# Patient Record
Sex: Female | Born: 1970 | Race: White | Hispanic: No | Marital: Single | Smoking: Never smoker
Health system: Southern US, Community
[De-identification: ages and names within clinical notes are randomized; demographics above are authoritative.]

## PROBLEM LIST (undated history)

## (undated) DIAGNOSIS — E669 Obesity, unspecified: Secondary | ICD-10-CM

## (undated) DIAGNOSIS — F411 Generalized anxiety disorder: Secondary | ICD-10-CM

## (undated) DIAGNOSIS — G4700 Insomnia, unspecified: Secondary | ICD-10-CM

## (undated) DIAGNOSIS — E890 Postprocedural hypothyroidism: Secondary | ICD-10-CM

## (undated) DIAGNOSIS — Z85850 Personal history of malignant neoplasm of thyroid: Secondary | ICD-10-CM

## (undated) HISTORY — DX: Personal history of malignant neoplasm of thyroid: Z85.850

## (undated) HISTORY — PX: THYROIDECTOMY: SHX17

## (undated) HISTORY — DX: Obesity, unspecified: E66.9

## (undated) HISTORY — PX: COLONOSCOPY: SHX174

## (undated) HISTORY — DX: Generalized anxiety disorder: F41.1

## (undated) HISTORY — DX: Insomnia, unspecified: G47.00

## (undated) HISTORY — PX: ABDOMINAL HYSTERECTOMY: SUR658

---

## 2014-04-30 DIAGNOSIS — C73 Malignant neoplasm of thyroid gland: Secondary | ICD-10-CM

## 2014-04-30 HISTORY — DX: Malignant neoplasm of thyroid gland: C73

## 2015-02-08 DIAGNOSIS — C73 Malignant neoplasm of thyroid gland: Secondary | ICD-10-CM | POA: Insufficient documentation

## 2015-04-18 DIAGNOSIS — E890 Postprocedural hypothyroidism: Secondary | ICD-10-CM | POA: Diagnosis present

## 2015-09-19 DIAGNOSIS — C73 Malignant neoplasm of thyroid gland: Secondary | ICD-10-CM | POA: Diagnosis not present

## 2015-09-19 DIAGNOSIS — E890 Postprocedural hypothyroidism: Secondary | ICD-10-CM | POA: Diagnosis not present

## 2015-09-19 DIAGNOSIS — Z79899 Other long term (current) drug therapy: Secondary | ICD-10-CM | POA: Diagnosis not present

## 2015-11-02 DIAGNOSIS — R600 Localized edema: Secondary | ICD-10-CM | POA: Diagnosis not present

## 2015-12-03 DIAGNOSIS — M940 Chondrocostal junction syndrome [Tietze]: Secondary | ICD-10-CM | POA: Diagnosis not present

## 2015-12-19 DIAGNOSIS — K080 Exfoliation of teeth due to systemic causes: Secondary | ICD-10-CM | POA: Diagnosis not present

## 2015-12-20 DIAGNOSIS — C73 Malignant neoplasm of thyroid gland: Secondary | ICD-10-CM | POA: Diagnosis not present

## 2016-02-03 DIAGNOSIS — Z1389 Encounter for screening for other disorder: Secondary | ICD-10-CM | POA: Diagnosis not present

## 2016-02-03 DIAGNOSIS — E669 Obesity, unspecified: Secondary | ICD-10-CM | POA: Diagnosis not present

## 2016-02-03 DIAGNOSIS — Z0000 Encounter for general adult medical examination without abnormal findings: Secondary | ICD-10-CM | POA: Diagnosis not present

## 2016-02-03 DIAGNOSIS — Z6830 Body mass index (BMI) 30.0-30.9, adult: Secondary | ICD-10-CM | POA: Diagnosis not present

## 2016-06-04 DIAGNOSIS — Z79899 Other long term (current) drug therapy: Secondary | ICD-10-CM | POA: Diagnosis not present

## 2016-06-04 DIAGNOSIS — E890 Postprocedural hypothyroidism: Secondary | ICD-10-CM | POA: Diagnosis not present

## 2016-06-04 DIAGNOSIS — C73 Malignant neoplasm of thyroid gland: Secondary | ICD-10-CM | POA: Diagnosis not present

## 2016-06-04 DIAGNOSIS — Z85850 Personal history of malignant neoplasm of thyroid: Secondary | ICD-10-CM | POA: Diagnosis not present

## 2016-06-25 DIAGNOSIS — M25552 Pain in left hip: Secondary | ICD-10-CM | POA: Diagnosis not present

## 2016-06-25 DIAGNOSIS — M25551 Pain in right hip: Secondary | ICD-10-CM | POA: Diagnosis not present

## 2016-06-25 DIAGNOSIS — K080 Exfoliation of teeth due to systemic causes: Secondary | ICD-10-CM | POA: Diagnosis not present

## 2016-08-22 DIAGNOSIS — J0100 Acute maxillary sinusitis, unspecified: Secondary | ICD-10-CM | POA: Diagnosis not present

## 2016-08-22 DIAGNOSIS — M62838 Other muscle spasm: Secondary | ICD-10-CM | POA: Diagnosis not present

## 2016-09-05 DIAGNOSIS — M79604 Pain in right leg: Secondary | ICD-10-CM | POA: Diagnosis not present

## 2016-09-05 DIAGNOSIS — M79605 Pain in left leg: Secondary | ICD-10-CM | POA: Diagnosis not present

## 2016-09-05 DIAGNOSIS — M545 Low back pain: Secondary | ICD-10-CM | POA: Diagnosis not present

## 2016-09-05 DIAGNOSIS — M6281 Muscle weakness (generalized): Secondary | ICD-10-CM | POA: Diagnosis not present

## 2016-09-12 DIAGNOSIS — M545 Low back pain: Secondary | ICD-10-CM | POA: Diagnosis not present

## 2016-09-12 DIAGNOSIS — M79604 Pain in right leg: Secondary | ICD-10-CM | POA: Diagnosis not present

## 2016-09-12 DIAGNOSIS — M6281 Muscle weakness (generalized): Secondary | ICD-10-CM | POA: Diagnosis not present

## 2016-09-12 DIAGNOSIS — M79605 Pain in left leg: Secondary | ICD-10-CM | POA: Diagnosis not present

## 2016-09-17 DIAGNOSIS — M6281 Muscle weakness (generalized): Secondary | ICD-10-CM | POA: Diagnosis not present

## 2016-09-17 DIAGNOSIS — M79605 Pain in left leg: Secondary | ICD-10-CM | POA: Diagnosis not present

## 2016-09-17 DIAGNOSIS — M79604 Pain in right leg: Secondary | ICD-10-CM | POA: Diagnosis not present

## 2016-09-17 DIAGNOSIS — M545 Low back pain: Secondary | ICD-10-CM | POA: Diagnosis not present

## 2016-09-19 DIAGNOSIS — M545 Low back pain: Secondary | ICD-10-CM | POA: Diagnosis not present

## 2016-09-19 DIAGNOSIS — M79604 Pain in right leg: Secondary | ICD-10-CM | POA: Diagnosis not present

## 2016-09-19 DIAGNOSIS — M6281 Muscle weakness (generalized): Secondary | ICD-10-CM | POA: Diagnosis not present

## 2016-09-19 DIAGNOSIS — M79605 Pain in left leg: Secondary | ICD-10-CM | POA: Diagnosis not present

## 2016-09-25 DIAGNOSIS — M79604 Pain in right leg: Secondary | ICD-10-CM | POA: Diagnosis not present

## 2016-09-25 DIAGNOSIS — M6281 Muscle weakness (generalized): Secondary | ICD-10-CM | POA: Diagnosis not present

## 2016-09-25 DIAGNOSIS — M79605 Pain in left leg: Secondary | ICD-10-CM | POA: Diagnosis not present

## 2016-09-25 DIAGNOSIS — M545 Low back pain: Secondary | ICD-10-CM | POA: Diagnosis not present

## 2016-09-28 DIAGNOSIS — M79605 Pain in left leg: Secondary | ICD-10-CM | POA: Diagnosis not present

## 2016-09-28 DIAGNOSIS — M79604 Pain in right leg: Secondary | ICD-10-CM | POA: Diagnosis not present

## 2016-09-28 DIAGNOSIS — M6281 Muscle weakness (generalized): Secondary | ICD-10-CM | POA: Diagnosis not present

## 2016-09-28 DIAGNOSIS — M545 Low back pain: Secondary | ICD-10-CM | POA: Diagnosis not present

## 2016-10-05 DIAGNOSIS — M79604 Pain in right leg: Secondary | ICD-10-CM | POA: Diagnosis not present

## 2016-10-05 DIAGNOSIS — M79605 Pain in left leg: Secondary | ICD-10-CM | POA: Diagnosis not present

## 2016-10-05 DIAGNOSIS — M545 Low back pain: Secondary | ICD-10-CM | POA: Diagnosis not present

## 2016-10-05 DIAGNOSIS — M6281 Muscle weakness (generalized): Secondary | ICD-10-CM | POA: Diagnosis not present

## 2016-10-09 DIAGNOSIS — M6281 Muscle weakness (generalized): Secondary | ICD-10-CM | POA: Diagnosis not present

## 2016-10-09 DIAGNOSIS — M79605 Pain in left leg: Secondary | ICD-10-CM | POA: Diagnosis not present

## 2016-10-09 DIAGNOSIS — M79604 Pain in right leg: Secondary | ICD-10-CM | POA: Diagnosis not present

## 2016-10-09 DIAGNOSIS — M545 Low back pain: Secondary | ICD-10-CM | POA: Diagnosis not present

## 2016-10-19 DIAGNOSIS — M79604 Pain in right leg: Secondary | ICD-10-CM | POA: Diagnosis not present

## 2016-10-19 DIAGNOSIS — M545 Low back pain: Secondary | ICD-10-CM | POA: Diagnosis not present

## 2016-10-19 DIAGNOSIS — M79605 Pain in left leg: Secondary | ICD-10-CM | POA: Diagnosis not present

## 2016-10-19 DIAGNOSIS — M6281 Muscle weakness (generalized): Secondary | ICD-10-CM | POA: Diagnosis not present

## 2016-10-24 DIAGNOSIS — M79605 Pain in left leg: Secondary | ICD-10-CM | POA: Diagnosis not present

## 2016-10-24 DIAGNOSIS — M79604 Pain in right leg: Secondary | ICD-10-CM | POA: Diagnosis not present

## 2016-10-24 DIAGNOSIS — M6281 Muscle weakness (generalized): Secondary | ICD-10-CM | POA: Diagnosis not present

## 2016-10-24 DIAGNOSIS — M545 Low back pain: Secondary | ICD-10-CM | POA: Diagnosis not present

## 2016-10-29 DIAGNOSIS — M79605 Pain in left leg: Secondary | ICD-10-CM | POA: Diagnosis not present

## 2016-10-29 DIAGNOSIS — M79604 Pain in right leg: Secondary | ICD-10-CM | POA: Diagnosis not present

## 2016-10-29 DIAGNOSIS — M545 Low back pain: Secondary | ICD-10-CM | POA: Diagnosis not present

## 2016-10-29 DIAGNOSIS — M6281 Muscle weakness (generalized): Secondary | ICD-10-CM | POA: Diagnosis not present

## 2016-11-06 DIAGNOSIS — M6281 Muscle weakness (generalized): Secondary | ICD-10-CM | POA: Diagnosis not present

## 2016-11-06 DIAGNOSIS — M79605 Pain in left leg: Secondary | ICD-10-CM | POA: Diagnosis not present

## 2016-11-06 DIAGNOSIS — M545 Low back pain: Secondary | ICD-10-CM | POA: Diagnosis not present

## 2016-11-06 DIAGNOSIS — M79604 Pain in right leg: Secondary | ICD-10-CM | POA: Diagnosis not present

## 2016-11-14 DIAGNOSIS — M79605 Pain in left leg: Secondary | ICD-10-CM | POA: Diagnosis not present

## 2016-11-14 DIAGNOSIS — M79604 Pain in right leg: Secondary | ICD-10-CM | POA: Diagnosis not present

## 2016-11-14 DIAGNOSIS — M545 Low back pain: Secondary | ICD-10-CM | POA: Diagnosis not present

## 2016-11-14 DIAGNOSIS — M6281 Muscle weakness (generalized): Secondary | ICD-10-CM | POA: Diagnosis not present

## 2016-11-26 DIAGNOSIS — D2271 Melanocytic nevi of right lower limb, including hip: Secondary | ICD-10-CM | POA: Diagnosis not present

## 2016-11-26 DIAGNOSIS — C44112 Basal cell carcinoma of skin of right eyelid, including canthus: Secondary | ICD-10-CM | POA: Diagnosis not present

## 2016-11-26 DIAGNOSIS — D485 Neoplasm of uncertain behavior of skin: Secondary | ICD-10-CM | POA: Diagnosis not present

## 2016-11-26 DIAGNOSIS — D1801 Hemangioma of skin and subcutaneous tissue: Secondary | ICD-10-CM | POA: Diagnosis not present

## 2016-11-28 DIAGNOSIS — M6281 Muscle weakness (generalized): Secondary | ICD-10-CM | POA: Diagnosis not present

## 2016-11-28 DIAGNOSIS — M545 Low back pain: Secondary | ICD-10-CM | POA: Diagnosis not present

## 2016-11-28 DIAGNOSIS — M79605 Pain in left leg: Secondary | ICD-10-CM | POA: Diagnosis not present

## 2016-11-28 DIAGNOSIS — M79604 Pain in right leg: Secondary | ICD-10-CM | POA: Diagnosis not present

## 2016-12-24 DIAGNOSIS — K080 Exfoliation of teeth due to systemic causes: Secondary | ICD-10-CM | POA: Diagnosis not present

## 2016-12-25 DIAGNOSIS — C73 Malignant neoplasm of thyroid gland: Secondary | ICD-10-CM | POA: Diagnosis not present

## 2016-12-25 DIAGNOSIS — E559 Vitamin D deficiency, unspecified: Secondary | ICD-10-CM | POA: Diagnosis not present

## 2016-12-25 DIAGNOSIS — E890 Postprocedural hypothyroidism: Secondary | ICD-10-CM | POA: Diagnosis not present

## 2017-02-04 DIAGNOSIS — E039 Hypothyroidism, unspecified: Secondary | ICD-10-CM | POA: Diagnosis not present

## 2017-02-04 DIAGNOSIS — Z23 Encounter for immunization: Secondary | ICD-10-CM | POA: Diagnosis not present

## 2017-06-01 DIAGNOSIS — R509 Fever, unspecified: Secondary | ICD-10-CM | POA: Diagnosis not present

## 2017-06-01 DIAGNOSIS — J0100 Acute maxillary sinusitis, unspecified: Secondary | ICD-10-CM | POA: Diagnosis not present

## 2017-06-01 DIAGNOSIS — R05 Cough: Secondary | ICD-10-CM | POA: Diagnosis not present

## 2017-06-18 DIAGNOSIS — K080 Exfoliation of teeth due to systemic causes: Secondary | ICD-10-CM | POA: Diagnosis not present

## 2017-07-01 DIAGNOSIS — E890 Postprocedural hypothyroidism: Secondary | ICD-10-CM | POA: Diagnosis not present

## 2017-07-01 DIAGNOSIS — E559 Vitamin D deficiency, unspecified: Secondary | ICD-10-CM | POA: Diagnosis not present

## 2017-07-01 DIAGNOSIS — C73 Malignant neoplasm of thyroid gland: Secondary | ICD-10-CM | POA: Diagnosis not present

## 2017-08-12 DIAGNOSIS — J0100 Acute maxillary sinusitis, unspecified: Secondary | ICD-10-CM | POA: Diagnosis not present

## 2017-10-28 DIAGNOSIS — J069 Acute upper respiratory infection, unspecified: Secondary | ICD-10-CM | POA: Diagnosis not present

## 2017-11-27 DIAGNOSIS — J351 Hypertrophy of tonsils: Secondary | ICD-10-CM | POA: Diagnosis not present

## 2017-12-24 DIAGNOSIS — K080 Exfoliation of teeth due to systemic causes: Secondary | ICD-10-CM | POA: Diagnosis not present

## 2018-01-09 DIAGNOSIS — Z0000 Encounter for general adult medical examination without abnormal findings: Secondary | ICD-10-CM | POA: Diagnosis not present

## 2018-01-09 DIAGNOSIS — E039 Hypothyroidism, unspecified: Secondary | ICD-10-CM | POA: Diagnosis not present

## 2018-01-09 DIAGNOSIS — Z85850 Personal history of malignant neoplasm of thyroid: Secondary | ICD-10-CM | POA: Diagnosis not present

## 2018-01-09 DIAGNOSIS — Z1331 Encounter for screening for depression: Secondary | ICD-10-CM | POA: Diagnosis not present

## 2018-01-09 DIAGNOSIS — K080 Exfoliation of teeth due to systemic causes: Secondary | ICD-10-CM | POA: Diagnosis not present

## 2018-01-09 DIAGNOSIS — Z6832 Body mass index (BMI) 32.0-32.9, adult: Secondary | ICD-10-CM | POA: Diagnosis not present

## 2018-02-07 DIAGNOSIS — Z1231 Encounter for screening mammogram for malignant neoplasm of breast: Secondary | ICD-10-CM | POA: Diagnosis not present

## 2018-06-30 DIAGNOSIS — K080 Exfoliation of teeth due to systemic causes: Secondary | ICD-10-CM | POA: Diagnosis not present

## 2018-11-25 DIAGNOSIS — Z0000 Encounter for general adult medical examination without abnormal findings: Secondary | ICD-10-CM | POA: Diagnosis not present

## 2018-11-25 DIAGNOSIS — Z1331 Encounter for screening for depression: Secondary | ICD-10-CM | POA: Diagnosis not present

## 2018-11-25 DIAGNOSIS — E669 Obesity, unspecified: Secondary | ICD-10-CM | POA: Diagnosis not present

## 2018-11-25 DIAGNOSIS — Z13220 Encounter for screening for lipoid disorders: Secondary | ICD-10-CM | POA: Diagnosis not present

## 2018-11-25 DIAGNOSIS — F411 Generalized anxiety disorder: Secondary | ICD-10-CM | POA: Diagnosis not present

## 2018-11-25 DIAGNOSIS — E039 Hypothyroidism, unspecified: Secondary | ICD-10-CM | POA: Diagnosis not present

## 2019-01-01 DIAGNOSIS — K080 Exfoliation of teeth due to systemic causes: Secondary | ICD-10-CM | POA: Diagnosis not present

## 2019-01-26 DIAGNOSIS — D225 Melanocytic nevi of trunk: Secondary | ICD-10-CM | POA: Diagnosis not present

## 2019-01-26 DIAGNOSIS — D2239 Melanocytic nevi of other parts of face: Secondary | ICD-10-CM | POA: Diagnosis not present

## 2019-01-26 DIAGNOSIS — D1801 Hemangioma of skin and subcutaneous tissue: Secondary | ICD-10-CM | POA: Diagnosis not present

## 2019-01-26 DIAGNOSIS — L821 Other seborrheic keratosis: Secondary | ICD-10-CM | POA: Diagnosis not present

## 2019-02-11 DIAGNOSIS — R590 Localized enlarged lymph nodes: Secondary | ICD-10-CM | POA: Diagnosis not present

## 2019-02-11 DIAGNOSIS — C73 Malignant neoplasm of thyroid gland: Secondary | ICD-10-CM | POA: Diagnosis not present

## 2019-02-16 DIAGNOSIS — E890 Postprocedural hypothyroidism: Secondary | ICD-10-CM | POA: Diagnosis not present

## 2019-02-16 DIAGNOSIS — C73 Malignant neoplasm of thyroid gland: Secondary | ICD-10-CM | POA: Diagnosis not present

## 2019-02-23 DIAGNOSIS — J358 Other chronic diseases of tonsils and adenoids: Secondary | ICD-10-CM | POA: Diagnosis not present

## 2019-04-01 DIAGNOSIS — Z1231 Encounter for screening mammogram for malignant neoplasm of breast: Secondary | ICD-10-CM | POA: Diagnosis not present

## 2019-05-06 DIAGNOSIS — R928 Other abnormal and inconclusive findings on diagnostic imaging of breast: Secondary | ICD-10-CM | POA: Diagnosis not present

## 2019-08-04 DIAGNOSIS — N762 Acute vulvitis: Secondary | ICD-10-CM | POA: Diagnosis not present

## 2019-08-04 DIAGNOSIS — Z124 Encounter for screening for malignant neoplasm of cervix: Secondary | ICD-10-CM | POA: Diagnosis not present

## 2019-09-08 ENCOUNTER — Emergency Department (HOSPITAL_COMMUNITY): Payer: BC Managed Care – PPO

## 2019-09-08 ENCOUNTER — Other Ambulatory Visit: Payer: Self-pay

## 2019-09-08 ENCOUNTER — Inpatient Hospital Stay (HOSPITAL_COMMUNITY)
Admission: EM | Admit: 2019-09-08 | Discharge: 2019-09-11 | DRG: 417 | Disposition: A | Discharge status: Home / Self Care | Payer: BC Managed Care – PPO | Attending: Internal Medicine | Admitting: Internal Medicine

## 2019-09-08 DIAGNOSIS — K8020 Calculus of gallbladder without cholecystitis without obstruction: Secondary | ICD-10-CM | POA: Diagnosis not present

## 2019-09-08 DIAGNOSIS — Z20822 Contact with and (suspected) exposure to covid-19: Secondary | ICD-10-CM | POA: Diagnosis not present

## 2019-09-08 DIAGNOSIS — K859 Acute pancreatitis without necrosis or infection, unspecified: Secondary | ICD-10-CM

## 2019-09-08 DIAGNOSIS — K8062 Calculus of gallbladder and bile duct with acute cholecystitis without obstruction: Principal | ICD-10-CM | POA: Diagnosis present

## 2019-09-08 DIAGNOSIS — E890 Postprocedural hypothyroidism: Secondary | ICD-10-CM | POA: Diagnosis not present

## 2019-09-08 DIAGNOSIS — K915 Postcholecystectomy syndrome: Secondary | ICD-10-CM | POA: Diagnosis not present

## 2019-09-08 DIAGNOSIS — Z90711 Acquired absence of uterus with remaining cervical stump: Secondary | ICD-10-CM | POA: Diagnosis not present

## 2019-09-08 DIAGNOSIS — R1011 Right upper quadrant pain: Secondary | ICD-10-CM | POA: Diagnosis not present

## 2019-09-08 DIAGNOSIS — Z79899 Other long term (current) drug therapy: Secondary | ICD-10-CM | POA: Diagnosis not present

## 2019-09-08 DIAGNOSIS — K810 Acute cholecystitis: Secondary | ICD-10-CM | POA: Diagnosis present

## 2019-09-08 DIAGNOSIS — K8010 Calculus of gallbladder with chronic cholecystitis without obstruction: Secondary | ICD-10-CM | POA: Diagnosis not present

## 2019-09-08 DIAGNOSIS — R945 Abnormal results of liver function studies: Secondary | ICD-10-CM | POA: Diagnosis not present

## 2019-09-08 DIAGNOSIS — Z800 Family history of malignant neoplasm of digestive organs: Secondary | ICD-10-CM | POA: Diagnosis not present

## 2019-09-08 DIAGNOSIS — Z85850 Personal history of malignant neoplasm of thyroid: Secondary | ICD-10-CM

## 2019-09-08 DIAGNOSIS — K660 Peritoneal adhesions (postprocedural) (postinfection): Secondary | ICD-10-CM | POA: Diagnosis present

## 2019-09-08 DIAGNOSIS — K851 Biliary acute pancreatitis without necrosis or infection: Secondary | ICD-10-CM | POA: Diagnosis not present

## 2019-09-08 DIAGNOSIS — R1084 Generalized abdominal pain: Secondary | ICD-10-CM | POA: Diagnosis not present

## 2019-09-08 DIAGNOSIS — R1013 Epigastric pain: Secondary | ICD-10-CM | POA: Diagnosis not present

## 2019-09-08 DIAGNOSIS — G8929 Other chronic pain: Secondary | ICD-10-CM | POA: Diagnosis not present

## 2019-09-08 DIAGNOSIS — Z79890 Hormone replacement therapy (postmenopausal): Secondary | ICD-10-CM | POA: Diagnosis not present

## 2019-09-08 DIAGNOSIS — R7989 Other specified abnormal findings of blood chemistry: Secondary | ICD-10-CM | POA: Diagnosis not present

## 2019-09-08 DIAGNOSIS — Z419 Encounter for procedure for purposes other than remedying health state, unspecified: Secondary | ICD-10-CM

## 2019-09-08 DIAGNOSIS — R1012 Left upper quadrant pain: Secondary | ICD-10-CM | POA: Diagnosis not present

## 2019-09-08 HISTORY — DX: Postprocedural hypothyroidism: E89.0

## 2019-09-08 LAB — CBC
HCT: 54.4 % — ABNORMAL HIGH (ref 36.0–46.0)
Hemoglobin: 17.7 g/dL — ABNORMAL HIGH (ref 12.0–15.0)
MCH: 30.9 pg (ref 26.0–34.0)
MCHC: 32.5 g/dL (ref 30.0–36.0)
MCV: 94.9 fL (ref 80.0–100.0)
Platelets: 261 10*3/uL (ref 150–400)
RBC: 5.73 MIL/uL — ABNORMAL HIGH (ref 3.87–5.11)
RDW: 14.2 % (ref 11.5–15.5)
WBC: 14.8 10*3/uL — ABNORMAL HIGH (ref 4.0–10.5)
nRBC: 0 % (ref 0.0–0.2)

## 2019-09-08 LAB — COMPREHENSIVE METABOLIC PANEL WITH GFR
ALT: 745 U/L — ABNORMAL HIGH (ref 0–44)
AST: 421 U/L — ABNORMAL HIGH (ref 15–41)
Albumin: 4.5 g/dL (ref 3.5–5.0)
Alkaline Phosphatase: 237 U/L — ABNORMAL HIGH (ref 38–126)
Anion gap: 14 (ref 5–15)
BUN: 11 mg/dL (ref 6–20)
CO2: 21 mmol/L — ABNORMAL LOW (ref 22–32)
Calcium: 10 mg/dL (ref 8.9–10.3)
Chloride: 104 mmol/L (ref 98–111)
Creatinine, Ser: 0.9 mg/dL (ref 0.44–1.00)
GFR calc Af Amer: >60 mL/min
GFR calc non Af Amer: >60 mL/min
Glucose, Bld: 169 mg/dL — ABNORMAL HIGH (ref 70–99)
Potassium: 4.5 mmol/L (ref 3.5–5.1)
Sodium: 139 mmol/L (ref 135–145)
Total Bilirubin: 1.3 mg/dL — ABNORMAL HIGH (ref 0.3–1.2)
Total Protein: 7.8 g/dL (ref 6.5–8.1)

## 2019-09-08 LAB — URINALYSIS, ROUTINE W REFLEX MICROSCOPIC
Bilirubin Urine: NEGATIVE
Glucose, UA: NEGATIVE mg/dL
Hgb urine dipstick: NEGATIVE
Ketones, ur: 20 mg/dL — AB
Nitrite: NEGATIVE
Protein, ur: NEGATIVE mg/dL
Specific Gravity, Urine: 1.016 (ref 1.005–1.030)
pH: 5 (ref 5.0–8.0)

## 2019-09-08 LAB — LIPASE, BLOOD: Lipase: 2881 U/L — ABNORMAL HIGH (ref 11–51)

## 2019-09-08 MED ORDER — MORPHINE SULFATE (PF) 4 MG/ML IV SOLN
4.0000 mg | Freq: Once | INTRAVENOUS | Status: AC
  Administered 2019-09-08: 4 mg via INTRAVENOUS
  Filled 2019-09-08: qty 1

## 2019-09-08 MED ORDER — ONDANSETRON HCL 4 MG/2ML IJ SOLN
4.0000 mg | Freq: Once | INTRAMUSCULAR | Status: AC
  Administered 2019-09-08: 4 mg via INTRAVENOUS
  Filled 2019-09-08: qty 2

## 2019-09-08 MED ORDER — IOHEXOL 300 MG/ML  SOLN
100.0000 mL | Freq: Once | INTRAMUSCULAR | Status: AC | PRN
  Administered 2019-09-08: 100 mL via INTRAVENOUS

## 2019-09-08 MED ORDER — HYDROMORPHONE HCL 1 MG/ML IJ SOLN
1.0000 mg | Freq: Once | INTRAMUSCULAR | Status: AC
  Administered 2019-09-08: 1 mg via INTRAVENOUS
  Filled 2019-09-08: qty 1

## 2019-09-08 MED ORDER — SODIUM CHLORIDE 0.9 % IV BOLUS
1000.0000 mL | Freq: Once | INTRAVENOUS | Status: AC
  Administered 2019-09-08: 1000 mL via INTRAVENOUS

## 2019-09-08 MED ORDER — SODIUM CHLORIDE 0.9% FLUSH: 3.0000 mL | Freq: Once | INTRAVENOUS | Status: DC

## 2019-09-08 MED ORDER — PIPERACILLIN-TAZOBACTAM 3.375 G IVPB 30 MIN
3.3750 g | Freq: Once | INTRAVENOUS | Status: AC
  Administered 2019-09-08: 3.375 g via INTRAVENOUS
  Filled 2019-09-08: qty 50

## 2019-09-08 NOTE — ED Provider Notes (Signed)
[facility] Provider Note   CSN: [id number] Arrival date & time: 09/08/19  1919    History Chief Complaint  Patient presents with  . Abdominal Pain    Jordan Pollard is a 49 y.o. female with past medical history who presents for evaluation of abdominal pain.  Patient with epigastric abdominal pain and nausea x2 days.  Has had similar episodes in the past however typically self resolved.  Was seen by PCP earlier today however unknown with blood work.  No prior imaging performed.  Denies fever, chills lightheadedness, dizziness, chest pain, shortness of breath, diarrhea, dysuria.  Denies additional aggravating or alleviating factors.  Has been taking Tylenol and ibuprofen without relief.  No alcohol use, chronic NSAID use.  Does have history of partial hysterectomy.  Rates pain an 8/10.    History obtained from patient and past medical records.  No interpreter is used.  Last PO intake 1800- Toast Prior Abd surgeries partial hysterectomy  HPI     No past medical history on file.  There are no problems to display for this patient.  History reviewed.   OB History   No obstetric history on file.     No family history on file.  Social History   Tobacco Use  . Smoking status: Not on file  Substance Use Topics  . Alcohol use: Not on file  . Drug use: Not on file    Home Medications Prior to Admission medications   Medication Sig Start Date End Date Taking? Authorizing Provider  acetaminophen (TYLENOL) 500 MG tablet Take 1,000 mg by mouth daily as needed for mild pain or moderate pain.   Yes [provider]  ALPRAZolam Duanne Moron) 0.5 MG tablet Take 0.5 mg by mouth 2 (two) times daily as needed for anxiety. 04/20/19  Yes [provider]  calcium carbonate (CALCIUM-CARB 600) 600 MG TABS tablet Take 1 tablet by mouth daily.   Yes [provider]  Cholecalciferol (VITAMIN D3) 125 MCG (5000 UT) CAPS Take 1 capsule  by mouth daily.   Yes [provider]  clotrimazole-betamethasone (LOTRISONE) lotion Apply 1 application topically 2 (two) times daily as needed for rash. 08/05/19  Yes [provider]  fluticasone (FLONASE) 50 MCG/ACT nasal spray Place 1 spray into the nose at bedtime as needed for allergies.   Yes [provider]  glucosamine-chondroitin 500-400 MG tablet Take 2 tablets by mouth.   Yes [provider]  ibuprofen (ADVIL) 200 MG tablet Take 400-600 mg by mouth every 8 (eight) hours as needed for fever or moderate pain.   Yes [provider]  levothyroxine (SYNTHROID) 125 MCG tablet Take 0.5-1 tablets by mouth See admin instructions. Take 1 tablet a day except for Sunday, take 1/2 tablet. 08/19/19  Yes [provider]  omega-3 fish oil (MAXEPA) 1000 MG CAPS capsule Take 2 capsules by mouth daily.   Yes [provider]  psyllium (REGULOID) 0.52 g capsule Take 2 capsules by mouth daily.   Yes [provider]    Allergies    Patient has no known allergies.  Review of Systems   Review of Systems  Constitutional: Negative.   HENT: Negative.   Respiratory: Negative.   Cardiovascular: Negative.   Gastrointestinal: Positive for abdominal pain, nausea and vomiting. Negative for abdominal distention, anal bleeding, blood in stool, constipation, diarrhea and rectal pain.  Genitourinary: Negative.   Musculoskeletal: Negative.   Skin: Negative.   Neurological: Negative.   All other  systems reviewed and are negative.   Physical Exam Updated Vital Signs BP 125/61 (BP Location: Right Arm)   Pulse 71   Temp 99.3 F (37.4 C) (Rectal)   Resp 18   SpO2 100%   Physical Exam Vitals and nursing note reviewed.  Constitutional:      General: She is not in acute distress.    Appearance: She is well-developed. She is not ill-appearing, toxic-appearing or diaphoretic.  HENT:     Head: Normocephalic and atraumatic.     Mouth/Throat:      Mouth: Mucous membranes are moist.  Eyes:     Pupils: Pupils are equal, round, and reactive to light.  Cardiovascular:     Rate and Rhythm: Normal rate.  Pulmonary:     Effort: Pulmonary effort is normal. No respiratory distress.     Breath sounds: Normal breath sounds.  Abdominal:     General: Bowel sounds are normal. There is no distension.     Palpations: Abdomen is soft.     Tenderness: There is abdominal tenderness in the right upper quadrant and epigastric area. There is guarding. There is no right CVA tenderness, left CVA tenderness or rebound. Positive signs include Murphy's sign. Negative signs include McBurney's sign.     Hernia: No hernia is present.  Musculoskeletal:        General: Normal range of motion.     Cervical back: Normal range of motion.  Skin:    General: Skin is warm and dry.     Capillary Refill: Capillary refill takes less than 2 seconds.  Neurological:     General: No focal deficit present.     Mental Status: She is alert.     ED Results / Procedures / Treatments   Labs (all labs ordered are listed, but only abnormal results are displayed) Labs Reviewed  LIPASE, BLOOD - Abnormal; Notable for the following components:      Result Value   Lipase 2,881 (*)    All other components within normal limits  COMPREHENSIVE METABOLIC PANEL - Abnormal; Notable for the following components:   CO2 21 (*)    Glucose, Bld 169 (*)    AST 421 (*)    ALT 745 (*)    Alkaline Phosphatase 237 (*)    Total Bilirubin 1.3 (*)    All other components within normal limits  CBC - Abnormal; Notable for the following components:   WBC 14.8 (*)    RBC 5.73 (*)    Hemoglobin 17.7 (*)    HCT 54.4 (*)    All other components within normal limits  URINALYSIS, ROUTINE W REFLEX MICROSCOPIC - Abnormal; Notable for the following components:   Ketones, ur 20 (*)    Leukocytes,Ua TRACE (*)    Bacteria, UA RARE (*)    All other components within normal limits  SARS CORONAVIRUS  2 BY RT PCR (HOSPITAL ORDER, [facility])  HEPATITIS PANEL, ACUTE    EKG EKG Interpretation  Date/Time:  Tuesday Sep 08 2019 19:23:45 EDT Ventricular Rate:  76 PR Interval:  132 QRS Duration: 80 QT Interval:  378 QTC Calculation: 425 R Axis:   76 Text Interpretation: Normal sinus rhythm Cannot rule out Anterior infarct , age undetermined Abnormal ECG No STEMI Confirmed by Octaviano Glow [id number]) on 09/08/2019 10:06:19 PM   Radiology CT ABDOMEN PELVIS W CONTRAST  Result Date: 09/08/2019 CLINICAL DATA:  Epigastric pain and nausea EXAM: CT ABDOMEN AND PELVIS WITH CONTRAST TECHNIQUE: Multidetector CT  imaging of the abdomen and pelvis was performed using the standard protocol following bolus administration of intravenous contrast. CONTRAST:  161mL OMNIPAQUE IOHEXOL 300 MG/ML  SOLN COMPARISON:  None. FINDINGS: LOWER CHEST: Normal. HEPATOBILIARY: Normal hepatic contours. No intra- or extrahepatic biliary dilatation. The gallbladder is normal. PANCREAS: Large amount of inflammatory stranding adjacent to the body and tail the pancreas. No peripancreatic fluid collection. No parenchymal pancreatic abnormality. Pancreatic duct is normal. SPLEEN: Normal. ADRENALS/URINARY TRACT: The adrenal glands are normal. No hydronephrosis, nephroureterolithiasis or solid renal mass. The urinary bladder is normal for degree of distention STOMACH/BOWEL: There is no hiatal hernia. Normal duodenal course and caliber. No small bowel dilatation or inflammation. No focal colonic abnormality. Normal appendix. VASCULAR/LYMPHATIC: Normal course and caliber of the major abdominal vessels. No abdominal or pelvic lymphadenopathy. REPRODUCTIVE: Status post hysterectomy. No adnexal mass. MUSCULOSKELETAL. No bony spinal canal stenosis or focal osseous abnormality. OTHER: None. IMPRESSION: Acute pancreatitis without peripancreatic fluid collection or evidence of pancreatic necrosis. Electronically Signed   By:  Ulyses Jarred M.D.   On: 09/08/2019 23:17   US Abdomen Limited RUQ  Result Date: 09/08/2019 CLINICAL DATA:  Right upper quadrant pain EXAM: ULTRASOUND ABDOMEN LIMITED RIGHT UPPER QUADRANT COMPARISON:  None. FINDINGS: Gallbladder: Multiple gallstones. Positive sonographic Percell Miller sign was reported by the sonographer. No gallbladder wall thickening or pericholecystic fluid. Common bile duct: Diameter: 4 mm Liver: No focal lesion identified. Within normal limits in parenchymal echogenicity. Portal vein is patent on color Doppler imaging with normal direction of blood flow towards the liver. Other: None. IMPRESSION: Cholelithiasis with a positive sonographic Murphy sign. This is compatible with acute cholecystitis in the appropriate context. However, the findings on the concomitant CT of the abdomen and pelvis are more consistent with acute pancreatitis. Electronically Signed   By: Ulyses Jarred M.D.   On: 09/08/2019 23:22   Procedures Procedures (including critical care time)  Medications Ordered in ED Medications  sodium chloride flush (NS) 0.9 % injection 3 mL (3 mLs Intravenous Not Given 09/08/19 2224)  piperacillin-tazobactam (ZOSYN) IVPB 3.375 g (3.375 g Intravenous New Bag/Given 09/08/19 2351)  sodium chloride 0.9 % bolus 1,000 mL (0 mLs Intravenous Stopped 09/08/19 2347)  ondansetron (ZOFRAN) injection 4 mg (4 mg Intravenous Given 09/08/19 2229)  morphine 4 MG/ML injection 4 mg (4 mg Intravenous Given 09/08/19 2230)  iohexol (OMNIPAQUE) 300 MG/ML solution 100 mL (100 mLs Intravenous Contrast Given 09/08/19 2306)  HYDROmorphone (DILAUDID) injection 1 mg (1 mg Intravenous Given 09/08/19 2349)   ED Course  I have reviewed the triage vital signs and the nursing notes.  Pertinent labs & imaging results that were available during my care of the patient were reviewed by me and considered in my medical decision making (see chart for details).  49 year old female presents for evaluation abdominal pain x2  days.  She is nonseptic appearing.  Has low-grade temp orally at 99.6 will obtain rectal.  Patient with positive Murphy sign and tenderness to epigastric region with guarding no rebound.  Heart and lungs clear.  Blood pressure stable.  No tachycardia, tachypnea or hypoxia.  Labs obtained from triage.  Labs and imaging personally reviewed and interpreted Urinalysis negative for infection CBC leukocytosis at 14.8, hemoglobin 17.7 likely hemodilution Metabolic panel mild hyperglycemia to 169, elevated LFTs. Lipase 2881 [place] without bladder wall thickening however cholilithiasis with positive Murphy sign, question cholecystitis however no surrounding inflammation. CT abd with acute pancreatitis, no duct dilation.  Given elevated leukocytosis, elevated LFT and concern for possible obstructing stone  will give Abx incase of infections process such as cholangitis.   Patient reassessed. Discussed imaging findings. Will plan to consult with GI and hospitalist to admit for further workup.  CONSULT with Dr. Havery Moros with [facility] recommends antibiotics, MRCP and they will see in the morning.  Recommends fluids, pain control.  Will review results of MRCP when they round in the morning. Patient will likely need surgical consult after obstructing stone r/o  Care transferred to [facility], PA-C who will follow up on hospital admission.  Patient seen and evaluated by attending Dr. Langston Masker who agrees with above treatment, plan and disposition.  The patient appears reasonably stabilized for admission considering the current resources, flow, and capabilities available in the ED at this time, and I doubt any other [facility] requiring further screening and/or treatment in the ED prior to admission.    MDM Rules/Calculators/A&P                       Final Clinical Impression(s) / ED Diagnoses Final diagnoses:  RUQ pain  Acute pancreatitis, unspecified complication status, unspecified pancreatitis type  Elevated  LFTs    Rx / DC Orders ED Discharge Orders    None       Pearson Reasons A, PA-C 09/09/19 0005    Wyvonnia Dusky, MD 09/09/19 [phone]

## 2019-09-08 NOTE — ED Triage Notes (Signed)
Pt c/o epigastric pain and nausea x 2 days. Pt reports she has had similar episodes before, but pain normally resolves. Denies vomiting/diarrhea. Seen at [facility] today, no change in symptoms.

## 2019-09-09 ENCOUNTER — Inpatient Hospital Stay (HOSPITAL_COMMUNITY): Payer: BC Managed Care – PPO

## 2019-09-09 ENCOUNTER — Encounter (HOSPITAL_COMMUNITY): Payer: Self-pay | Admitting: Internal Medicine

## 2019-09-09 DIAGNOSIS — K8010 Calculus of gallbladder with chronic cholecystitis without obstruction: Secondary | ICD-10-CM | POA: Diagnosis not present

## 2019-09-09 DIAGNOSIS — K810 Acute cholecystitis: Secondary | ICD-10-CM | POA: Diagnosis not present

## 2019-09-09 DIAGNOSIS — Z800 Family history of malignant neoplasm of digestive organs: Secondary | ICD-10-CM | POA: Diagnosis not present

## 2019-09-09 DIAGNOSIS — Z79899 Other long term (current) drug therapy: Secondary | ICD-10-CM | POA: Diagnosis not present

## 2019-09-09 DIAGNOSIS — G8929 Other chronic pain: Secondary | ICD-10-CM

## 2019-09-09 DIAGNOSIS — K8020 Calculus of gallbladder without cholecystitis without obstruction: Secondary | ICD-10-CM | POA: Diagnosis not present

## 2019-09-09 DIAGNOSIS — R7989 Other specified abnormal findings of blood chemistry: Secondary | ICD-10-CM | POA: Diagnosis not present

## 2019-09-09 DIAGNOSIS — R1011 Right upper quadrant pain: Secondary | ICD-10-CM | POA: Diagnosis present

## 2019-09-09 DIAGNOSIS — K660 Peritoneal adhesions (postprocedural) (postinfection): Secondary | ICD-10-CM | POA: Diagnosis present

## 2019-09-09 DIAGNOSIS — K851 Biliary acute pancreatitis without necrosis or infection: Secondary | ICD-10-CM

## 2019-09-09 DIAGNOSIS — K8062 Calculus of gallbladder and bile duct with acute cholecystitis without obstruction: Secondary | ICD-10-CM | POA: Diagnosis present

## 2019-09-09 DIAGNOSIS — Z85850 Personal history of malignant neoplasm of thyroid: Secondary | ICD-10-CM | POA: Diagnosis not present

## 2019-09-09 DIAGNOSIS — Z79890 Hormone replacement therapy (postmenopausal): Secondary | ICD-10-CM | POA: Diagnosis not present

## 2019-09-09 DIAGNOSIS — Z20822 Contact with and (suspected) exposure to covid-19: Secondary | ICD-10-CM | POA: Diagnosis present

## 2019-09-09 DIAGNOSIS — E890 Postprocedural hypothyroidism: Secondary | ICD-10-CM

## 2019-09-09 DIAGNOSIS — R1013 Epigastric pain: Secondary | ICD-10-CM | POA: Diagnosis not present

## 2019-09-09 DIAGNOSIS — Z90711 Acquired absence of uterus with remaining cervical stump: Secondary | ICD-10-CM | POA: Diagnosis not present

## 2019-09-09 LAB — CBC WITH DIFFERENTIAL/PLATELET
Abs Immature Granulocytes: 0.03 10*3/uL (ref 0.00–0.07)
Basophils Absolute: 0 10*3/uL (ref 0.0–0.1)
Basophils Relative: 0 %
Eosinophils Absolute: 0 10*3/uL (ref 0.0–0.5)
Eosinophils Relative: 0 %
HCT: 45.9 % (ref 36.0–46.0)
Hemoglobin: 14.9 g/dL (ref 12.0–15.0)
Immature Granulocytes: 0 %
Lymphocytes Relative: 9 %
Lymphs Abs: 1 10*3/uL (ref 0.7–4.0)
MCH: 30.5 pg (ref 26.0–34.0)
MCHC: 32.5 g/dL (ref 30.0–36.0)
MCV: 93.9 fL (ref 80.0–100.0)
Monocytes Absolute: 0.5 10*3/uL (ref 0.1–1.0)
Monocytes Relative: 5 %
Neutro Abs: 9.5 10*3/uL — ABNORMAL HIGH (ref 1.7–7.7)
Neutrophils Relative %: 86 %
Platelets: 229 10*3/uL (ref 150–400)
RBC: 4.89 MIL/uL (ref 3.87–5.11)
RDW: 14.4 % (ref 11.5–15.5)
WBC: 11.2 10*3/uL — ABNORMAL HIGH (ref 4.0–10.5)
nRBC: 0 % (ref 0.0–0.2)

## 2019-09-09 LAB — LIPID PANEL
Cholesterol: 193 mg/dL (ref 0–200)
HDL: 81 mg/dL (ref >40)
LDL Cholesterol: 105 mg/dL — ABNORMAL HIGH (ref 0–99)
Total CHOL/HDL Ratio: 2.4 RATIO
Triglycerides: 34 mg/dL (ref <150)
VLDL: 7 mg/dL (ref 0–40)

## 2019-09-09 LAB — COMPREHENSIVE METABOLIC PANEL
ALT: 483 U/L — ABNORMAL HIGH (ref 0–44)
AST: 190 U/L — ABNORMAL HIGH (ref 15–41)
Albumin: 3.4 g/dL — ABNORMAL LOW (ref 3.5–5.0)
Alkaline Phosphatase: 174 U/L — ABNORMAL HIGH (ref 38–126)
Anion gap: 13 (ref 5–15)
BUN: 10 mg/dL (ref 6–20)
CO2: 21 mmol/L — ABNORMAL LOW (ref 22–32)
Calcium: 8.6 mg/dL — ABNORMAL LOW (ref 8.9–10.3)
Chloride: 107 mmol/L (ref 98–111)
Creatinine, Ser: 0.78 mg/dL (ref 0.44–1.00)
GFR calc Af Amer: >60 mL/min (ref >60)
GFR calc non Af Amer: >60 mL/min (ref >60)
Glucose, Bld: 122 mg/dL — ABNORMAL HIGH (ref 70–99)
Potassium: 3.9 mmol/L (ref 3.5–5.1)
Sodium: 141 mmol/L (ref 135–145)
Total Bilirubin: 1.1 mg/dL (ref 0.3–1.2)
Total Protein: 6 g/dL — ABNORMAL LOW (ref 6.5–8.1)

## 2019-09-09 LAB — RAPID URINE DRUG SCREEN, HOSP PERFORMED
Amphetamines: NOT DETECTED
Barbiturates: NOT DETECTED
Benzodiazepines: NOT DETECTED
Cocaine: NOT DETECTED
Opiates: NOT DETECTED
Tetrahydrocannabinol: NOT DETECTED

## 2019-09-09 LAB — LACTIC ACID, PLASMA: Lactic Acid, Venous: 1.2 mmol/L (ref 0.5–1.9)

## 2019-09-09 LAB — HIV ANTIBODY (ROUTINE TESTING W REFLEX): HIV Screen 4th Generation wRfx: NONREACTIVE

## 2019-09-09 LAB — TSH: TSH: 0.805 u[IU]/mL (ref 0.350–4.500)

## 2019-09-09 LAB — HEPATITIS PANEL, ACUTE
HCV Ab: NONREACTIVE
Hep A IgM: NONREACTIVE
Hep B C IgM: NONREACTIVE
Hepatitis B Surface Ag: NONREACTIVE

## 2019-09-09 LAB — SARS CORONAVIRUS 2 BY RT PCR (HOSPITAL ORDER, PERFORMED IN ~~LOC~~ HOSPITAL LAB): SARS Coronavirus 2: NEGATIVE

## 2019-09-09 MED ORDER — HYDROMORPHONE HCL 1 MG/ML IJ SOLN: 0.5000 mg | INTRAMUSCULAR | Status: DC | PRN

## 2019-09-09 MED ORDER — GADOBUTROL 1 MMOL/ML IV SOLN
7.5000 mL | Freq: Once | INTRAVENOUS | Status: AC | PRN
  Administered 2019-09-09: 7.5 mL via INTRAVENOUS

## 2019-09-09 MED ORDER — LEVOTHYROXINE SODIUM 100 MCG/5ML IV SOLN
62.5000 ug | Freq: Every day | INTRAVENOUS | Status: DC
  Administered 2019-09-11: 62.5 ug via INTRAVENOUS
  Filled 2019-09-09 (×2): qty 5

## 2019-09-09 MED ORDER — ONDANSETRON HCL 4 MG/2ML IJ SOLN
4.0000 mg | Freq: Four times a day (QID) | INTRAMUSCULAR | Status: DC | PRN
  Administered 2019-09-09: 4 mg via INTRAVENOUS
  Filled 2019-09-09 (×2): qty 2

## 2019-09-09 MED ORDER — LACTATED RINGERS IV SOLN: INTRAVENOUS | Status: DC

## 2019-09-09 MED ORDER — HYDROMORPHONE HCL 1 MG/ML IJ SOLN: 1.0000 mg | INTRAMUSCULAR | Status: DC | PRN

## 2019-09-09 MED ORDER — LORAZEPAM 2 MG/ML IJ SOLN
1.0000 mg | Freq: Once | INTRAMUSCULAR | Status: AC
  Administered 2019-09-09: 1 mg via INTRAVENOUS
  Filled 2019-09-09: qty 1

## 2019-09-09 MED ORDER — ACETAMINOPHEN 650 MG RE SUPP: 650.0000 mg | Freq: Four times a day (QID) | RECTAL | Status: DC | PRN

## 2019-09-09 MED ORDER — PIPERACILLIN-TAZOBACTAM 3.375 G IVPB
3.3750 g | Freq: Three times a day (TID) | INTRAVENOUS | Status: DC
  Administered 2019-09-09 – 2019-09-10 (×5): 3.375 g via INTRAVENOUS
  Filled 2019-09-09 (×6): qty 50

## 2019-09-09 MED ORDER — ENOXAPARIN SODIUM 40 MG/0.4ML ~~LOC~~ SOLN
40.0000 mg | Freq: Every day | SUBCUTANEOUS | Status: DC
  Administered 2019-09-09: 40 mg via SUBCUTANEOUS
  Filled 2019-09-09: qty 0.4

## 2019-09-09 MED ORDER — LACTATED RINGERS IV SOLN: INTRAVENOUS | Status: AC

## 2019-09-09 MED ORDER — ACETAMINOPHEN 325 MG PO TABS
650.0000 mg | ORAL_TABLET | Freq: Four times a day (QID) | ORAL | Status: DC | PRN
  Administered 2019-09-09: 650 mg via ORAL
  Filled 2019-09-09: qty 2

## 2019-09-09 MED ORDER — PIPERACILLIN-TAZOBACTAM 3.375 G IVPB 30 MIN: 3.3750 g | Freq: Four times a day (QID) | INTRAVENOUS | Status: DC

## 2019-09-09 MED ORDER — SODIUM CHLORIDE 0.9 % IV SOLN: Freq: Once | INTRAVENOUS | Status: AC

## 2019-09-09 MED ORDER — SODIUM CHLORIDE 0.9 % IV BOLUS
1000.0000 mL | Freq: Once | INTRAVENOUS | Status: AC
  Administered 2019-09-09: 01:00:00 1000 mL via INTRAVENOUS

## 2019-09-09 NOTE — Consult Note (Signed)
Jordan Pollard July 28, 1970  [id number].    Requesting MD: Dr. Horris Latino Chief Complaint/Reason for Consult: Gallstone pancreatitis   HPI: Jordan Pollard is a 49 y.o. female with a history postsurgical hypothyroidism who presented with nausea and epigastric pain.  Patient reports since March she has been having intermittent episodes of upper abdominal pain that radiated to her back, typically occurring after dinner and when lying down to sleep at night.  She notes the symptoms would last for several hours before resolving.  Her symptoms were not relieved with Tums, ibuprofen or Tylenol.  She notes that she had an episode over the weekend as well as Monday (5/10) that was more severe, now with associated nausea and did not resolve that prompted her to present to the emergency department on 5/11.  She denies any fever, chills, emesis or change in bowel habits.  Since presentation she has been afebrile without tachycardia. WBC 14.8 >> 11.2. Lipase 2881. Alk Phos 237 >> 174, AST 421 >> 190, ALT 745 >> 483, T bili 1.3 >> 1.1.  RUQ ultrasound showed cholelithiasis with positive Murphy sign.  No gallbladder wall thickening or pericholecystic fluid.  CBD 4 mm.  CT A/P with acute pancreatitis without peripancreatic fluid collection or necrosis.  MRCP shows acute pancreatitis, mildly dilated CBD measuring 8 mm without evidence of filling defects.  This also showed multiple gallstones towards the neck of the gallbladder without evidence of acute cholecystitis.  Medicine admitted.  Patient was started on IV antibiotics and bowel rest.  GI consulted.  We were asked to see.  Patient reports prior myomectomy and laparoscopic partial hysterectomy.  She is not on blood thinners.  She denies any alcohol, tobacco or illicit drug use.  She is a Pharmacist, hospital.  ROS: Review of Systems  Constitutional: Negative for chills and fever.  Respiratory: Negative for shortness of breath.   Cardiovascular: Negative for chest  pain.  Gastrointestinal: Positive for abdominal pain and nausea. Negative for diarrhea and vomiting.  Genitourinary: Negative for dysuria.  Musculoskeletal: Positive for back pain.  Psychiatric/Behavioral: Negative for substance abuse.  All other systems reviewed and are negative.   Family History  Problem Relation Age of Onset  . Colon cancer Paternal Grandfather     Past Medical History:  Diagnosis Date  . 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 Post-surgical hypothyroidism     History reviewed. No pertinent surgical history.  Social History:  reports that she has never smoked. She has never used smokeless tobacco. No history on file for alcohol and drug.  Allergies: No Known Allergies  Medications Prior to Admission  Medication Sig Dispense Refill  . acetaminophen (TYLENOL) 500 MG tablet Take 1,000 mg by mouth daily as needed for mild pain or moderate pain.    Marland Kitchen ALPRAZolam (XANAX) 0.5 MG tablet Take 0.5 mg by mouth 2 (two) times daily as needed for anxiety.    . calcium carbonate (CALCIUM-CARB 600) 600 MG TABS tablet Take 1 tablet by mouth daily.    . Cholecalciferol (VITAMIN D3) 125 MCG (5000 UT) CAPS Take 1 capsule by mouth daily.    . clotrimazole-betamethasone (LOTRISONE) lotion Apply 1 application topically 2 (two) times daily as needed for rash.    . fluticasone (FLONASE) 50 MCG/ACT nasal spray Place 1 spray into the nose at bedtime as needed for allergies.    Marland Kitchen glucosamine-chondroitin 500-400 MG tablet Take 2 tablets by mouth.    Marland Kitchen ibuprofen (ADVIL) 200 MG tablet  Take 400-600 mg by mouth every 8 (eight) hours as needed for fever or moderate pain.    Marland Kitchen levothyroxine (SYNTHROID) 125 MCG tablet Take 0.5-1 tablets by mouth See admin instructions. Take 1 tablet a day except for Sunday, take 1/2 tablet.    Marland Kitchen omega-3 fish oil (MAXEPA) 1000 MG CAPS capsule Take 2 capsules by mouth daily.    . psyllium (REGULOID) 0.52 g capsule Take 2 capsules by mouth  daily.       Physical Exam: Blood pressure 114/77, pulse 90, temperature 98.3 F (36.8 C), temperature source Oral, resp. rate 16, SpO2 95 %. General: pleasant, WD/WN white female who is laying in bed in NAD HEENT: head is normocephalic, atraumatic.  Sclera are noninjected.  PERRL.  Ears and nose without any masses or lesions.  Mouth is pink and moist. Dentition fair Heart: regular, rate, and rhythm.  Normal s1,s2. No obvious murmurs, gallops, or rubs noted.  Palpable pedal pulses bilaterally  Lungs: CTAB, no wheezes, rhonchi, or rales noted.  Respiratory effort nonlabored Abd: Soft, ND, tenderness of the epigastrium > RUQ without r/r/g. +BS, no masses, hernias, or organomegaly. Prior lower transversae abdominal incisional scar noted that is well healed. Not able to visualize prior laparoscopic scars well.  MS: no BUE/BLE edema, calves soft and nontender Skin: warm and dry with no masses, lesions, or rashes Psych: A&Ox4 with an appropriate affect Neuro: cranial nerves grossly intact, equal strength in BUE/BLE bilaterally, normal speech, though process intact   Results for orders placed or performed during the hospital encounter of 09/08/19 (from the past 48 hour(s))  Urinalysis, Routine w reflex microscopic     Status: Abnormal   Collection Time: 09/08/19  7:27 PM  Result Value Ref Range   Color, Urine YELLOW YELLOW   APPearance CLEAR CLEAR   Specific Gravity, Urine 1.016 1.005 - 1.030   pH 5.0 5.0 - 8.0   Glucose, UA NEGATIVE NEGATIVE mg/dL   Hgb urine dipstick NEGATIVE NEGATIVE   Bilirubin Urine NEGATIVE NEGATIVE   Ketones, ur 20 (A) NEGATIVE mg/dL   Protein, ur NEGATIVE NEGATIVE mg/dL   Nitrite NEGATIVE NEGATIVE   Leukocytes,Ua TRACE (A) NEGATIVE   RBC / HPF 0-5 0 - 5 RBC/hpf   WBC, UA 0-5 0 - 5 WBC/hpf   Bacteria, UA RARE (A) NONE SEEN   Squamous Epithelial / LPF 0-5 0 - 5   Mucus PRESENT     Comment: Performed at [facility], [address] [place], [place]  [address]  Lipase, blood     Status: Abnormal   Collection Time: 09/08/19  7:37 PM  Result Value Ref Range   Lipase 2,881 (H) 11 - 51 U/L    Comment: RESULTS CONFIRMED BY MANUAL DILUTION Performed at [facility], [place] [address] [place], [place] [address]   Comprehensive metabolic panel     Status: Abnormal   Collection Time: 09/08/19  7:37 PM  Result Value Ref Range   Sodium 139 135 - 145 mmol/L   Potassium 4.5 3.5 - 5.1 mmol/L   Chloride 104 98 - 111 mmol/L   CO2 21 (L) 22 - 32 mmol/L   Glucose, Bld 169 (H) 70 - 99 mg/dL    Comment: Glucose reference range applies only to samples taken after fasting for at least 8 hours.   BUN 11 6 - 20 mg/dL   Creatinine, Ser 0.90 0.44 - 1.00 mg/dL   Calcium 10.0 8.9 - 10.3 mg/dL   Total Protein 7.8  6.5 - 8.1 g/dL   Albumin 4.5 3.5 - 5.0 g/dL   AST 421 (H) 15 - 41 U/L   ALT 745 (H) 0 - 44 U/L   Alkaline Phosphatase 237 (H) 38 - 126 U/L   Total Bilirubin 1.3 (H) 0.3 - 1.2 mg/dL   GFR calc non Af Amer >60 >60 mL/min   GFR calc Af Amer >60 >60 mL/min   Anion gap 14 5 - 15    Comment: Performed at [facility] [address] [place], [place] [address]  CBC     Status: Abnormal   Collection Time: 09/08/19  7:37 PM  Result Value Ref Range   WBC 14.8 (H) 4.0 - 10.5 K/uL   RBC 5.73 (H) 3.87 - 5.11 MIL/uL   Hemoglobin 17.7 (H) 12.0 - 15.0 g/dL   HCT 54.4 (H) 36.0 - 46.0 %   MCV 94.9 80.0 - 100.0 fL   MCH 30.9 26.0 - 34.0 pg   MCHC 32.5 30.0 - 36.0 g/dL   RDW 14.2 11.5 - 15.5 %   Platelets 261 150 - 400 K/uL   nRBC 0.0 0.0 - 0.2 %    Comment: Performed at [facility], [place] [address] [place], [place] [address]  Urine rapid drug screen (hosp performed)     Status: None   Collection Time: 09/08/19  7:49 PM  Result Value Ref Range   Opiates NONE DETECTED NONE DETECTED   Cocaine NONE DETECTED NONE DETECTED   Benzodiazepines NONE DETECTED NONE DETECTED   Amphetamines NONE DETECTED NONE DETECTED   Tetrahydrocannabinol NONE  DETECTED NONE DETECTED   Barbiturates NONE DETECTED NONE DETECTED    Comment: (NOTE) DRUG SCREEN FOR MEDICAL PURPOSES ONLY.  IF CONFIRMATION IS NEEDED FOR ANY PURPOSE, NOTIFY LAB WITHIN 5 DAYS. LOWEST DETECTABLE LIMITS FOR URINE DRUG SCREEN Drug Class                     Cutoff (ng/mL) Amphetamine and metabolites    1000 Barbiturate and metabolites    200 Benzodiazepine                 924 Tricyclics and metabolites     300 Opiates and metabolites        300 Cocaine and metabolites        300 THC                            50 Performed at [facility], [place] [address] [place], [place] [address]   Hepatitis panel, acute     Status: None   Collection Time: 09/08/19 11:22 PM  Result Value Ref Range   Hepatitis B Surface Ag NON REACTIVE NON REACTIVE   HCV Ab NON REACTIVE NON REACTIVE    Comment: (NOTE) Nonreactive HCV antibody screen is consistent with no HCV infections,  unless recent infection is suspected or other evidence exists to indicate HCV infection.    Hep A IgM NON REACTIVE NON REACTIVE   Hep B C IgM NON REACTIVE NON REACTIVE    Comment: Performed at [facility], [place] [address], [place], [place] [address]  SARS Coronavirus 2 by RT PCR (hospital order, performed in [facility] hospital lab) Nasopharyngeal Nasopharyngeal Swab     Status: None   Collection Time: 09/08/19 11:25 PM   Specimen: Nasopharyngeal Swab  Result Value Ref Range   SARS Coronavirus 2 NEGATIVE NEGATIVE  Comment: (NOTE) SARS-CoV-2 target nucleic acids are NOT DETECTED. The SARS-CoV-2 RNA is generally detectable in upper and lower respiratory specimens during the acute phase of infection. The lowest concentration of SARS-CoV-2 viral copies this assay can detect is 250 copies / mL. A negative result does not preclude SARS-CoV-2 infection and should not be used as the sole basis for treatment or other patient management decisions.  A negative result may occur with improper specimen  collection / handling, submission of specimen other than nasopharyngeal swab, presence of viral mutation(s) within the areas targeted by this assay, and inadequate number of viral copies (<250 copies / mL). A negative result must be combined with clinical observations, patient history, and epidemiological information. Fact Sheet for Patients:   StrictlyIdeas.no Fact Sheet for Healthcare Providers: BankingDealers.co.za This test is not yet approved or cleared  by the [place] FDA and has been authorized for detection and/or diagnosis of SARS-CoV-2 by FDA under an Emergency Use Authorization (EUA).  This EUA will remain in effect (meaning this test can be used) for the duration of the COVID-19 declaration under Section 564(b)(1) of the Act, 21 U.S.C. section 360bbb-3(b)(1), unless the authorization is terminated or revoked sooner. Performed at [facility], [place] [address] [place], [place] [address]   HIV Antibody (routine testing w rflx)     Status: None   Collection Time: 09/09/19  6:38 AM  Result Value Ref Range   HIV Screen 4th Generation wRfx Non Reactive Non Reactive    Comment: Performed at [facility], [place] [address] [place], [place] [address]  Comprehensive metabolic panel     Status: Abnormal   Collection Time: 09/09/19  6:38 AM  Result Value Ref Range   Sodium 141 135 - 145 mmol/L   Potassium 3.9 3.5 - 5.1 mmol/L   Chloride 107 98 - 111 mmol/L   CO2 21 (L) 22 - 32 mmol/L   Glucose, Bld 122 (H) 70 - 99 mg/dL    Comment: Glucose reference range applies only to samples taken after fasting for at least 8 hours.   BUN 10 6 - 20 mg/dL   Creatinine, Ser 0.78 0.44 - 1.00 mg/dL   Calcium 8.6 (L) 8.9 - 10.3 mg/dL   Total Protein 6.0 (L) 6.5 - 8.1 g/dL   Albumin 3.4 (L) 3.5 - 5.0 g/dL   AST 190 (H) 15 - 41 U/L   ALT 483 (H) 0 - 44 U/L   Alkaline Phosphatase 174 (H) 38 - 126 U/L   Total Bilirubin 1.1 0.3 - 1.2  mg/dL   GFR calc non Af Amer >60 >60 mL/min   GFR calc Af Amer >60 >60 mL/min   Anion gap 13 5 - 15    Comment: Performed at [facility] [address] [place], [place] [address]  CBC with Differential/Platelet     Status: Abnormal   Collection Time: 09/09/19  6:38 AM  Result Value Ref Range   WBC 11.2 (H) 4.0 - 10.5 K/uL   RBC 4.89 3.87 - 5.11 MIL/uL   Hemoglobin 14.9 12.0 - 15.0 g/dL   HCT 45.9 36.0 - 46.0 %   MCV 93.9 80.0 - 100.0 fL   MCH 30.5 26.0 - 34.0 pg   MCHC 32.5 30.0 - 36.0 g/dL   RDW 14.4 11.5 - 15.5 %   Platelets 229 150 - 400 K/uL   nRBC 0.0 0.0 - 0.2 %   Neutrophils Relative % 86 %   Neutro Abs  9.5 (H) 1.7 - 7.7 K/uL   Lymphocytes Relative 9 %   Lymphs Abs 1.0 0.7 - 4.0 K/uL   Monocytes Relative 5 %   Monocytes Absolute 0.5 0.1 - 1.0 K/uL   Eosinophils Relative 0 %   Eosinophils Absolute 0.0 0.0 - 0.5 K/uL   Basophils Relative 0 %   Basophils Absolute 0.0 0.0 - 0.1 K/uL   Immature Granulocytes 0 %   Abs Immature Granulocytes 0.03 0.00 - 0.07 K/uL    Comment: Performed at [facility] [address] [place], [place] [address]  TSH     Status: None   Collection Time: 09/09/19  6:38 AM  Result Value Ref Range   TSH 0.805 0.350 - 4.500 uIU/mL    Comment: Performed by a 3rd Generation assay with a functional sensitivity of <=0.01 uIU/mL. Performed at [facility], [place] [address] [place], [place] [address]   Lipid panel     Status: Abnormal   Collection Time: 09/09/19  6:38 AM  Result Value Ref Range   Cholesterol 193 0 - 200 mg/dL   Triglycerides 34 <150 mg/dL   HDL 81 >40 mg/dL   Total CHOL/HDL Ratio 2.4 RATIO   VLDL 7 0 - 40 mg/dL   LDL Cholesterol 105 (H) 0 - 99 mg/dL    Comment:        Total Cholesterol/HDL:CHD Risk Coronary Heart Disease Risk Table                     Men   Women  1/2 Average Risk   3.4   3.3  Average Risk       5.0   4.4  2 X Average Risk   9.6   7.1  3 X Average Risk  23.4   11.0        Use the  calculated Patient Ratio above and the CHD Risk Table to determine the patient's CHD Risk.        ATP III CLASSIFICATION (LDL):  <100     mg/dL   Optimal  100-129  mg/dL   Near or Above                    Optimal  130-159  mg/dL   Borderline  160-189  mg/dL   High  >190     mg/dL   Very High Performed at [facility] [address] [place], [place] [address]   Lactic acid, plasma     Status: None   Collection Time: 09/09/19  6:52 AM  Result Value Ref Range   Lactic Acid, Venous 1.2 0.5 - 1.9 mmol/L    Comment: Performed at [facility] [address] [place], [place] [address]   CT ABDOMEN PELVIS W CONTRAST  Result Date: 09/08/2019 CLINICAL DATA:  Epigastric pain and nausea EXAM: CT ABDOMEN AND PELVIS WITH CONTRAST TECHNIQUE: Multidetector CT imaging of the abdomen and pelvis was performed using the standard protocol following bolus administration of intravenous contrast. CONTRAST:  114m OMNIPAQUE IOHEXOL 300 MG/ML  SOLN COMPARISON:  None. FINDINGS: LOWER CHEST: Normal. HEPATOBILIARY: Normal hepatic contours. No intra- or extrahepatic biliary dilatation. The gallbladder is normal. PANCREAS: Large amount of inflammatory stranding adjacent to the body and tail the pancreas. No peripancreatic fluid collection. No parenchymal pancreatic abnormality. Pancreatic duct is normal. SPLEEN: Normal. ADRENALS/URINARY TRACT: The adrenal glands are normal. No hydronephrosis, nephroureterolithiasis or solid renal mass. The urinary bladder is normal  for degree of distention STOMACH/BOWEL: There is no hiatal hernia. Normal duodenal course and caliber. No small bowel dilatation or inflammation. No focal colonic abnormality. Normal appendix. VASCULAR/LYMPHATIC: Normal course and caliber of the major abdominal vessels. No abdominal or pelvic lymphadenopathy. REPRODUCTIVE: Status post hysterectomy. No adnexal mass. MUSCULOSKELETAL. No bony spinal canal stenosis or focal osseous abnormality. OTHER:  None. IMPRESSION: Acute pancreatitis without peripancreatic fluid collection or evidence of pancreatic necrosis. Electronically Signed   By: Ulyses Jarred M.D.   On: 09/08/2019 23:17   MR ABDOMEN MRCP W WO CONTAST  Result Date: 09/09/2019 CLINICAL DATA:  Epigastric pain and nausea for 2 days. EXAM: MRI ABDOMEN WITHOUT AND WITH CONTRAST (INCLUDING MRCP) TECHNIQUE: Multiplanar multisequence MR imaging of the abdomen was performed both before and after the administration of intravenous contrast. Heavily T2-weighted images of the biliary and pancreatic ducts were obtained, and three-dimensional MRCP images were rendered by post processing. CONTRAST:  7.60m GADAVIST GADOBUTROL 1 MMOL/ML IV SOLN COMPARISON:  Ultrasound and CT 09/08/1998 FINDINGS: Lower chest:  Lung bases are clear. Hepatobiliary: Several large gallstones and multiple small gallstones collect in the neck of the gallbladder. Gallbladder measures 3.7 cm diameter. No gallbladder wall thickening. No comparison cholecystic fluid. No clear gallbladder inflammation. The common hepatic duct and common bile duct are prominent and mildly dilated with the common bile duct measuring 8 mm in diameter. No filling defects within the common bile duct. Common bile duct is normal caliber through the pancreatic head measuring 4 mm and tapers smoothly to the ampulla (image 23/series 14/MRCP sequence) Postcontrast enhanced imaging demonstrates no hepatic lesion. Pancreas: Mild edema in the body and tail of the pancreas. Pancreatic duct is normal caliber. There is no variant pancreatic ductal anatomy. Postcontrast imaging demonstrates uniform enhancement of the pancreas. On T2 weighted imaging there is peripancreatic fluid (image 6/series 16). This fluid extends along the body and tail primarily. Small amount of fluid also collects in the anterior to the LEFT pararenal space. Edema extends into the central mesentery also (image 29/series 4). Spleen: Normal spleen.  Adrenals/urinary tract: Adrenal glands and kidneys are normal. Stomach/Bowel: Stomach and limited of the small bowel is unremarkable Vascular/Lymphatic: No upper abdominal adenopathy.  Abdominal aorta Musculoskeletal: No aggressive osseous lesion IMPRESSION: 1. Findings consistent acute pancreatitis with edema surrounding the body and tail the pancreas and extending into the mesentery along the LEFT pararenal space. No organized fluid collections. No pancreatic necrosis. 2. No variant pancreatic ductal anatomy. 3. Multiple large and small gallstones collect towards the neck of the gallbladder however no clear evidence of acute cholecystitis. No choledocholithiasis. 4. Potential gallstone pancreatitis with passage a gallstone through the common bile duct. Other etiologies of pancreatitis should also be considered. Electronically Signed   By: SSuzy BouchardM.D.   On: 09/09/2019 07:48   UKoreaAbdomen Limited RUQ  Result Date: 09/08/2019 CLINICAL DATA:  Right upper quadrant pain EXAM: ULTRASOUND ABDOMEN LIMITED RIGHT UPPER QUADRANT COMPARISON:  None. FINDINGS: Gallbladder: Multiple gallstones. Positive sonographic MPercell Millersign was reported by the sonographer. No gallbladder wall thickening or pericholecystic fluid. Common bile duct: Diameter: 4 mm Liver: No focal lesion identified. Within normal limits in parenchymal echogenicity. Portal vein is patent on color Doppler imaging with normal direction of blood flow towards the liver. Other: None. IMPRESSION: Cholelithiasis with a positive sonographic Murphy sign. This is compatible with acute cholecystitis in the appropriate context. However, the findings on the concomitant CT of the abdomen and pelvis are more consistent with acute pancreatitis. Electronically Signed  By: Ulyses Jarred M.D.   On: 09/08/2019 23:22   Anti-infectives (From admission, onward)   Start     Dose/Rate Route Frequency Ordered Stop   09/09/19 0600  piperacillin-tazobactam (ZOSYN) IVPB 3.375  g     3.375 g 12.5 mL/hr over 240 Minutes Intravenous Every 8 hours 09/09/19 0132     09/09/19 0530  piperacillin-tazobactam (ZOSYN) IVPB 3.375 g  Status:  Discontinued     3.375 g 100 mL/hr over 30 Minutes Intravenous Every 6 hours 09/09/19 0127 09/09/19 0131   09/08/19 2345  piperacillin-tazobactam (ZOSYN) IVPB 3.375 g     3.375 g 100 mL/hr over 30 Minutes Intravenous  Once 09/08/19 2331 09/09/19 0016      Assessment/Plan Biliary Pancreatitis - Will plan for cholecystectomy during admission when pain and labs improve - Trend Lipase and LFTs - Currently no evidence of choledocholithiasis on imaging.  Gastroenterology following.  May need IOC, if able, during Cholecystectomy  - I have explained the procedure, risks, and aftercare of cholecystectomy.  Risks include but are not limited to VTE, bleeding, infection, wound problems, anesthesia, diarrhea, bile leak, injury to common bile duct/liver/intestine.  She seems to understand and agrees to proceed when timing appropriate.   FEN - CLD VTE - SCDs, okay for chemical prophylaxis from general surgery standpoint ID - [place], [facility] Surgery 09/09/2019, 10:49 AM Please see Amion for pager number during day hours 7:00am-4:30pm

## 2019-09-09 NOTE — ED Notes (Signed)
Patient currently at MRI

## 2019-09-09 NOTE — Anesthesia Preprocedure Evaluation (Addendum)
Anesthesia Evaluation  Patient identified by MRN, date of birth, ID band Patient awake    Reviewed: Allergy & Precautions, NPO status , Patient's Chart, lab work & pertinent test results  History of Anesthesia Complications Negative for: history of anesthetic complications  Airway Mallampati: II  TM Distance: >3 FB Neck ROM: Full    Dental no notable dental hx.    Pulmonary neg pulmonary ROS,    Pulmonary exam normal        Cardiovascular negative cardio ROS Normal cardiovascular exam     Neuro/Psych Anxiety negative neurological ROS     GI/Hepatic Neg liver ROS, Gallstone pancreatitis   Endo/Other  Hypothyroidism   Renal/GU negative Renal ROS  negative genitourinary   Musculoskeletal negative musculoskeletal ROS (+)   Abdominal   Peds  Hematology negative hematology ROS (+)   Anesthesia Other Findings Day of surgery medications reviewed with patient.  Reproductive/Obstetrics negative OB ROS                            Anesthesia Physical Anesthesia Plan  ASA: II  Anesthesia Plan: General   Post-op Pain Management:    Induction: Intravenous  PONV Risk Score and Plan: 4 or greater and Ondansetron, Dexamethasone, Midazolam, Treatment may vary due to age or medical condition, Propofol infusion and Scopolamine patch - Pre-op  Airway Management Planned: Oral ETT  Additional Equipment: None  Intra-op Plan:   Post-operative Plan: Extubation in OR  Informed Consent: I have reviewed the patients History and Physical, chart, labs and discussed the procedure including the risks, benefits and alternatives for the proposed anesthesia with the patient or authorized representative who has indicated his/her understanding and acceptance.     Dental advisory given  Plan Discussed with: CRNA  Anesthesia Plan Comments:       Anesthesia Quick Evaluation

## 2019-09-09 NOTE — ED Provider Notes (Signed)
00:05: Patient care assumed from [facility] PA-C pending hospitalist consultation for admission.  Please see prior provider note for full H&P.  Overall patient here with acute pancreatitis, possible cholecystits, & concern for possible choledocholithiasis with her elevated LFTs, case has been discussed with gastroenterology, Dr. Havery Moros with [facility], in agreement with antibiotics, MRCP, will see in the morning, can hold off on general surgery consultation at this time.   Physical Exam  BP 125/61 (BP Location: Right Arm)   Pulse 71   Temp 99.3 F (37.4 C) (Rectal)   Resp 18   SpO2 100%   Physical Exam Vitals and nursing note reviewed.  Constitutional:      General: She is not in acute distress.    Appearance: She is well-developed.  HENT:     Head: Normocephalic and atraumatic.  Eyes:     General:        Right eye: No discharge.        Left eye: No discharge.     Conjunctiva/sclera: Conjunctivae normal.  Neurological:     Mental Status: She is alert.     Comments: Clear speech.   Psychiatric:        Behavior: Behavior normal.        Thought Content: Thought content normal.     ED Course/Procedures     Results for orders placed or performed during the hospital encounter of 09/08/19  Lipase, blood  Result Value Ref Range   Lipase 2,881 (H) 11 - 51 U/L  Comprehensive metabolic panel  Result Value Ref Range   Sodium 139 135 - 145 mmol/L   Potassium 4.5 3.5 - 5.1 mmol/L   Chloride 104 98 - 111 mmol/L   CO2 21 (L) 22 - 32 mmol/L   Glucose, Bld 169 (H) 70 - 99 mg/dL   BUN 11 6 - 20 mg/dL   Creatinine, Ser 0.90 0.44 - 1.00 mg/dL   Calcium 10.0 8.9 - 10.3 mg/dL   Total Protein 7.8 6.5 - 8.1 g/dL   Albumin 4.5 3.5 - 5.0 g/dL   AST 421 (H) 15 - 41 U/L   ALT 745 (H) 0 - 44 U/L   Alkaline Phosphatase 237 (H) 38 - 126 U/L   Total Bilirubin 1.3 (H) 0.3 - 1.2 mg/dL   GFR calc non Af Amer >60 >60 mL/min   GFR calc Af Amer >60 >60 mL/min   Anion gap 14 5 - 15  CBC   Result Value Ref Range   WBC 14.8 (H) 4.0 - 10.5 K/uL   RBC 5.73 (H) 3.87 - 5.11 MIL/uL   Hemoglobin 17.7 (H) 12.0 - 15.0 g/dL   HCT 54.4 (H) 36.0 - 46.0 %   MCV 94.9 80.0 - 100.0 fL   MCH 30.9 26.0 - 34.0 pg   MCHC 32.5 30.0 - 36.0 g/dL   RDW 14.2 11.5 - 15.5 %   Platelets 261 150 - 400 K/uL   nRBC 0.0 0.0 - 0.2 %  Urinalysis, Routine w reflex microscopic  Result Value Ref Range   Color, Urine YELLOW YELLOW   APPearance CLEAR CLEAR   Specific Gravity, Urine 1.016 1.005 - 1.030   pH 5.0 5.0 - 8.0   Glucose, UA NEGATIVE NEGATIVE mg/dL   Hgb urine dipstick NEGATIVE NEGATIVE   Bilirubin Urine NEGATIVE NEGATIVE   Ketones, ur 20 (A) NEGATIVE mg/dL   Protein, ur NEGATIVE NEGATIVE mg/dL   Nitrite NEGATIVE NEGATIVE   Leukocytes,Ua TRACE (A) NEGATIVE   RBC / HPF 0-5  0 - 5 RBC/hpf   WBC, UA 0-5 0 - 5 WBC/hpf   Bacteria, UA RARE (A) NONE SEEN   Squamous Epithelial / LPF 0-5 0 - 5   Mucus PRESENT    CT ABDOMEN PELVIS W CONTRAST  Result Date: 09/08/2019 CLINICAL DATA:  Epigastric pain and nausea EXAM: CT ABDOMEN AND PELVIS WITH CONTRAST TECHNIQUE: Multidetector CT imaging of the abdomen and pelvis was performed using the standard protocol following bolus administration of intravenous contrast. CONTRAST:  178mL OMNIPAQUE IOHEXOL 300 MG/ML  SOLN COMPARISON:  None. FINDINGS: LOWER CHEST: Normal. HEPATOBILIARY: Normal hepatic contours. No intra- or extrahepatic biliary dilatation. The gallbladder is normal. PANCREAS: Large amount of inflammatory stranding adjacent to the body and tail the pancreas. No peripancreatic fluid collection. No parenchymal pancreatic abnormality. Pancreatic duct is normal. SPLEEN: Normal. ADRENALS/URINARY TRACT: The adrenal glands are normal. No hydronephrosis, nephroureterolithiasis or solid renal mass. The urinary bladder is normal for degree of distention STOMACH/BOWEL: There is no hiatal hernia. Normal duodenal course and caliber. No small bowel dilatation or  inflammation. No focal colonic abnormality. Normal appendix. VASCULAR/LYMPHATIC: Normal course and caliber of the major abdominal vessels. No abdominal or pelvic lymphadenopathy. REPRODUCTIVE: Status post hysterectomy. No adnexal mass. MUSCULOSKELETAL. No bony spinal canal stenosis or focal osseous abnormality. OTHER: None. IMPRESSION: Acute pancreatitis without peripancreatic fluid collection or evidence of pancreatic necrosis. Electronically Signed   By: Ulyses Jarred M.D.   On: 09/08/2019 23:17   US Abdomen Limited RUQ  Result Date: 09/08/2019 CLINICAL DATA:  Right upper quadrant pain EXAM: ULTRASOUND ABDOMEN LIMITED RIGHT UPPER QUADRANT COMPARISON:  None. FINDINGS: Gallbladder: Multiple gallstones. Positive sonographic Percell Miller sign was reported by the sonographer. No gallbladder wall thickening or pericholecystic fluid. Common bile duct: Diameter: 4 mm Liver: No focal lesion identified. Within normal limits in parenchymal echogenicity. Portal vein is patent on color Doppler imaging with normal direction of blood flow towards the liver. Other: None. IMPRESSION: Cholelithiasis with a positive sonographic Murphy sign. This is compatible with acute cholecystitis in the appropriate context. However, the findings on the concomitant CT of the abdomen and pelvis are more consistent with acute pancreatitis. Electronically Signed   By: Ulyses Jarred M.D.   On: 09/08/2019 23:22     Procedures  MDM   00:27: CONSULT: Discussed with hospitalist Dr. [name]- accepts admission. We will order additional fluids.   00:35: RE-EVAL: Patient resting comfortably, declining further pain medication at this time.       Leafy Kindle 09/09/19 0041    Wyvonnia Dusky, MD 09/09/19 [phone]

## 2019-09-09 NOTE — Progress Notes (Signed)
PROGRESS NOTE  Jordan Pollard [id number] DOB: 1971/04/05 DOA: 09/08/2019 PCP: [name], MD  HPI/Recap of past 24 hours: HPI from Dr [name] 49 year old female with past medical history of papillary thyroid cancer status post thyroid resection and postsurgical hypothyroidism who presents to [facility] emergency department with complaints of severe worsening intermittent abdominal pain for the past 3 months located in the epigastric region, and radiating to the back. In the ED, CT abdomen revealed signs consistent with acute pancreatitis without evidence of hemorrhage or necrosis.  Lipase was found to be 2881.  Patient was also found to have markedly elevated AST of 421 and elevated ALT of 745, right upper quadrant ultrasound which revealed cholelithiasis with sonographic Percell Miller sign concerning for possible acute cholecystitis. Case was discussed with Dr. [name], GI who recommended obtaining an MRCP. Hospitalist group has now been called to assess patient for admission to the hospital.   Today, patient still complains of generalized abdominal pain, worse around the epigastric region, denies any nausea/vomiting, fever/chills, chest pain, shortness of breath.   Assessment/Plan: Principal Problem:   Acute gallstone pancreatitis Active Problems:   Acute cholecystitis   Hypothyroidism, postsurgical  Biliary pancreatitis Noted transaminitis, elevated lipase, will trend Right upper quadrant ultrasound concerning for possible acute cholecystitis MRCP showed pancreatitis, no no CBD stone GI on board, recommend clear liquid diet, IV fluids, continue Zosyn just in case of low-grade cholecystitis General surgery on board, plan for cholecystectomy during admission once pancreatitis has improved, may need IOC IV fluids, CLD, pain management Continue Zosyn  Leukocytosis Likely due to above Daily CBC  Hypothyroidism Continue Synthroid            Malnutrition  Type:      Malnutrition Characteristics:      Nutrition Interventions:       There is no height or weight on file to calculate BMI.     Code Status: Full  Family Communication: Discussed with patient and her friend at bedside  Disposition Plan: Status is: Inpatient  Remains inpatient appropriate because:Inpatient level of care appropriate due to severity of illness   Dispo: The patient is from: Home              Anticipated d/c is to: Home              Anticipated d/c date is: 2 days              Patient currently is not medically stable to d/c. Due to ongoing work-up, possible cholecystectomy while inpatient, IV fluids, pain management    Consultants:  General surgery  GI  Procedures:  None  Antimicrobials:  Zosyn  DVT prophylaxis: SCD   Objective: Vitals:   09/09/19 0223 09/09/19 0306 09/09/19 0432 09/09/19 1211  BP: (!) 112/55 116/76 114/77 (!) 101/52  Pulse: 64 84 90 72  Resp: 16 16 16 16   Temp:   98.3 F (36.8 C) 97.9 F (36.6 C)  TempSrc:   Oral Oral  SpO2: 99% 99% 95% 99%    Intake/Output Summary (Last 24 hours) at 09/09/2019 1549 Last data filed at 09/09/2019 0448 Gross per 24 hour  Intake 356.25 ml  Output --  Net 356.25 ml   There were no vitals filed for this visit.  Exam:  General: NAD   Cardiovascular: S1, S2 present  Respiratory: CTAB  Abdomen: Soft, +tender, nondistended, bowel sounds present  Musculoskeletal: No bilateral pedal edema noted  Skin: Normal  Psychiatry: Normal mood  Data Reviewed: CBC: Recent Labs  Lab 09/08/19 1937 09/09/19 0638  WBC 14.8* 11.2*  NEUTROABS  --  9.5*  HGB 17.7* 14.9  HCT 54.4* 45.9  MCV 94.9 93.9  PLT 261 [id number]   Basic Metabolic Panel: Recent Labs  Lab 09/08/19 1937 09/09/19 0638  NA 139 141  K 4.5 3.9  CL 104 107  CO2 21* 21*  GLUCOSE 169* 122*  BUN 11 10  CREATININE 0.90 0.78  CALCIUM 10.0 8.6*   GFR: CrCl cannot be calculated (Unknown ideal  weight.). Liver Function Tests: Recent Labs  Lab 09/08/19 1937 09/09/19 0638  AST 421* 190*  ALT 745* 483*  ALKPHOS 237* 174*  BILITOT 1.3* 1.1  PROT 7.8 6.0*  ALBUMIN 4.5 3.4*   Recent Labs  Lab 09/08/19 1937  LIPASE 2,881*   No results for input(s): AMMONIA in the last 168 hours. Coagulation Profile: No results for input(s): INR, PROTIME in the last 168 hours. Cardiac Enzymes: No results for input(s): CKTOTAL, CKMB, CKMBINDEX, TROPONINI in the last 168 hours. BNP (last 3 results) No results for input(s): PROBNP in the last 8760 hours. HbA1C: No results for input(s): HGBA1C in the last 72 hours. CBG: No results for input(s): GLUCAP in the last 168 hours. Lipid Profile: Recent Labs    09/09/19 0638  CHOL 193  HDL 81  LDLCALC 105*  TRIG 34  CHOLHDL 2.4   Thyroid Function Tests: Recent Labs    09/09/19 0638  TSH 0.805   Anemia Panel: No results for input(s): VITAMINB12, FOLATE, FERRITIN, TIBC, IRON, RETICCTPCT in the last 72 hours. Urine analysis:    Component Value Date/Time   COLORURINE YELLOW 09/08/2019 1927   APPEARANCEUR CLEAR 09/08/2019 1927   LABSPEC 1.016 09/08/2019 1927   PHURINE 5.0 09/08/2019 1927   GLUCOSEU NEGATIVE 09/08/2019 [place] NEGATIVE 09/08/2019 [place] NEGATIVE 09/08/2019 1927   KETONESUR 20 (A) 09/08/2019 1927   PROTEINUR NEGATIVE 09/08/2019 1927   NITRITE NEGATIVE 09/08/2019 1927   LEUKOCYTESUR TRACE (A) 09/08/2019 1927   Sepsis Labs: @LABRCNTIP (procalcitonin:4,lacticidven:4)  ) Recent Results (from the past 240 hour(s))  SARS Coronavirus 2 by RT PCR (hospital order, performed in [place] hospital lab) Nasopharyngeal Nasopharyngeal Swab     Status: None   Collection Time: 09/08/19 11:25 PM   Specimen: Nasopharyngeal Swab  Result Value Ref Range Status   SARS Coronavirus 2 NEGATIVE NEGATIVE Final    Comment: (NOTE) SARS-CoV-2 target nucleic acids are NOT DETECTED. The SARS-CoV-2 RNA is generally  detectable in upper and lower respiratory specimens during the acute phase of infection. The lowest concentration of SARS-CoV-2 viral copies this assay can detect is 250 copies / mL. A negative result does not preclude SARS-CoV-2 infection and should not be used as the sole basis for treatment or other patient management decisions.  A negative result may occur with improper specimen collection / handling, submission of specimen other than nasopharyngeal swab, presence of viral mutation(s) within the areas targeted by this assay, and inadequate number of viral copies (<250 copies / mL). A negative result must be combined with clinical observations, patient history, and epidemiological information. Fact Sheet for Patients:   StrictlyIdeas.no Fact Sheet for Healthcare Providers: BankingDealers.co.za This test is not yet approved or cleared  by the [place] FDA and has been authorized for detection and/or diagnosis of SARS-CoV-2 by FDA under an Emergency Use Authorization (EUA).  This EUA will remain in effect (meaning this test can be used) for the duration of  the COVID-19 declaration under Section 564(b)(1) of the Act, 21 U.S.C. section 360bbb-3(b)(1), unless the authorization is terminated or revoked sooner. Performed at [facility], [place] [address], [place], [place] [address]       Studies: CT ABDOMEN PELVIS W CONTRAST  Result Date: 09/08/2019 CLINICAL DATA:  Epigastric pain and nausea EXAM: CT ABDOMEN AND PELVIS WITH CONTRAST TECHNIQUE: Multidetector CT imaging of the abdomen and pelvis was performed using the standard protocol following bolus administration of intravenous contrast. CONTRAST:  181mL OMNIPAQUE IOHEXOL 300 MG/ML  SOLN COMPARISON:  None. FINDINGS: LOWER CHEST: Normal. HEPATOBILIARY: Normal hepatic contours. No intra- or extrahepatic biliary dilatation. The gallbladder is normal. PANCREAS: Large amount of  inflammatory stranding adjacent to the body and tail the pancreas. No peripancreatic fluid collection. No parenchymal pancreatic abnormality. Pancreatic duct is normal. SPLEEN: Normal. ADRENALS/URINARY TRACT: The adrenal glands are normal. No hydronephrosis, nephroureterolithiasis or solid renal mass. The urinary bladder is normal for degree of distention STOMACH/BOWEL: There is no hiatal hernia. Normal duodenal course and caliber. No small bowel dilatation or inflammation. No focal colonic abnormality. Normal appendix. VASCULAR/LYMPHATIC: Normal course and caliber of the major abdominal vessels. No abdominal or pelvic lymphadenopathy. REPRODUCTIVE: Status post hysterectomy. No adnexal mass. MUSCULOSKELETAL. No bony spinal canal stenosis or focal osseous abnormality. OTHER: None. IMPRESSION: Acute pancreatitis without peripancreatic fluid collection or evidence of pancreatic necrosis. Electronically Signed   By: Ulyses Jarred M.D.   On: 09/08/2019 23:17   MR ABDOMEN MRCP W WO CONTAST  Result Date: 09/09/2019 CLINICAL DATA:  Epigastric pain and nausea for 2 days. EXAM: MRI ABDOMEN WITHOUT AND WITH CONTRAST (INCLUDING MRCP) TECHNIQUE: Multiplanar multisequence MR imaging of the abdomen was performed both before and after the administration of intravenous contrast. Heavily T2-weighted images of the biliary and pancreatic ducts were obtained, and three-dimensional MRCP images were rendered by post processing. CONTRAST:  7.79mL GADAVIST GADOBUTROL 1 MMOL/ML IV SOLN COMPARISON:  Ultrasound and CT 09/08/1998 FINDINGS: Lower chest:  Lung bases are clear. Hepatobiliary: Several large gallstones and multiple small gallstones collect in the neck of the gallbladder. Gallbladder measures 3.7 cm diameter. No gallbladder wall thickening. No comparison cholecystic fluid. No clear gallbladder inflammation. The common hepatic duct and common bile duct are prominent and mildly dilated with the common bile duct measuring 8 mm in  diameter. No filling defects within the common bile duct. Common bile duct is normal caliber through the pancreatic head measuring 4 mm and tapers smoothly to the ampulla (image 23/series 14/MRCP sequence) Postcontrast enhanced imaging demonstrates no hepatic lesion. Pancreas: Mild edema in the body and tail of the pancreas. Pancreatic duct is normal caliber. There is no variant pancreatic ductal anatomy. Postcontrast imaging demonstrates uniform enhancement of the pancreas. On T2 weighted imaging there is peripancreatic fluid (image 6/series 16). This fluid extends along the body and tail primarily. Small amount of fluid also collects in the anterior to the LEFT pararenal space. Edema extends into the central mesentery also (image 29/series 4). Spleen: Normal spleen. Adrenals/urinary tract: Adrenal glands and kidneys are normal. Stomach/Bowel: Stomach and limited of the small bowel is unremarkable Vascular/Lymphatic: No upper abdominal adenopathy.  Abdominal aorta Musculoskeletal: No aggressive osseous lesion IMPRESSION: 1. Findings consistent acute pancreatitis with edema surrounding the body and tail the pancreas and extending into the mesentery along the LEFT pararenal space. No organized fluid collections. No pancreatic necrosis. 2. No variant pancreatic ductal anatomy. 3. Multiple large and small gallstones collect towards the neck of the gallbladder however no clear  evidence of acute cholecystitis. No choledocholithiasis. 4. Potential gallstone pancreatitis with passage a gallstone through the common bile duct. Other etiologies of pancreatitis should also be considered. Electronically Signed   By: Suzy Bouchard M.D.   On: 09/09/2019 07:48   US Abdomen Limited RUQ  Result Date: 09/08/2019 CLINICAL DATA:  Right upper quadrant pain EXAM: ULTRASOUND ABDOMEN LIMITED RIGHT UPPER QUADRANT COMPARISON:  None. FINDINGS: Gallbladder: Multiple gallstones. Positive sonographic Percell Miller sign was reported by the  sonographer. No gallbladder wall thickening or pericholecystic fluid. Common bile duct: Diameter: 4 mm Liver: No focal lesion identified. Within normal limits in parenchymal echogenicity. Portal vein is patent on color Doppler imaging with normal direction of blood flow towards the liver. Other: None. IMPRESSION: Cholelithiasis with a positive sonographic Murphy sign. This is compatible with acute cholecystitis in the appropriate context. However, the findings on the concomitant CT of the abdomen and pelvis are more consistent with acute pancreatitis. Electronically Signed   By: Ulyses Jarred M.D.   On: 09/08/2019 23:22    Scheduled Meds: . enoxaparin (LOVENOX) injection  40 mg Subcutaneous Daily  . [START ON 09/10/2019] levothyroxine  62.5 mcg Intravenous Daily  . sodium chloride flush  3 mL Intravenous Once    Continuous Infusions: . lactated ringers    . piperacillin-tazobactam (ZOSYN)  IV 3.375 g (09/09/19 0612)     LOS: 0 days     Alma Friendly, MD [facility]  If 7PM-7AM, please contact night-coverage [email or web address] 09/09/2019, 3:49 PM

## 2019-09-09 NOTE — H&P (Signed)
History and Physical    Jordan Pollard [id number] DOB: 06-May-1970 DOA: 09/08/2019  PCP: [name], MD  Patient coming from: Home   Chief Complaint:  Chief Complaint  Patient presents with  . 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  Patient explains that for the past 3 months she has been experiencing intermittent abdominal pain.  Patient describes abdominal pain is located in the epigastric region, severe in quality and radiating to the back.  Each episode lasts several hours before spontaneously resolving.  Denies any association of her pain with meals.  He has intermittent episodes of abdominal pain have occurred every several weeks for the past several months.  Monday morning, the patient developed another episode of epigastric pain however this episode was particularly severe.  Patient describes the pain as 10 out of 10 in intensity, sharp in quality, located in the epigastric region and radiating around to her back.  This was associated with intense nausea.  At this time, her symptoms did not resolve after several hours and continue to persist into the next day.  Patient denies dysuria, low back pain, diarrhea, vaginal discharge or fever.  Patient symptoms continue to persist until she eventually presented to [facility] emergency department for evaluation.  Evaluation in the emergency department, CT imaging of the abdomen revealed signs consistent with acute pancreatitis without evidence of hemorrhage or necrosis.  Lipase was found to be 2881.  Patient was also found to have markedly elevated AST of 421 and elevated ALT of 745.  This prompted the emergency department provider to obtain a right upper quadrant ultrasound which revealed  cholelithiasis with sonographic Percell Miller sign concerning for possible acute cholecystitis.  Case was discussed with Dr. [name] with neurology who recommended obtaining an MRCP and stated that gastroenterology will evaluate the patient in the morning and to hold off on calling general surgery at this time.  Intravenous Zosyn and intravenous fluids were initiated.  The hospitalist group has now been called to assess patient for admission to the hospital.  Review of Systems: A 10-system review of systems has been performed and all systems are negative with the exception of what is listed in the HPI.    Past Medical History:  Diagnosis Date  . Papillary thyroid carcinoma ([place]) 2016   S/P thyroidectomy  . Post-surgical hypothyroidism     History reviewed. No pertinent surgical history.   reports that she has never smoked. She has never used smokeless tobacco. No history on file for alcohol and drug.  No Known Allergies  Family History  Problem Relation Age of Onset  . Colon cancer Paternal Grandfather      Prior to Admission medications   Medication Sig Start Date End Date Taking? Authorizing Provider  acetaminophen (TYLENOL) 500 MG tablet Take 1,000 mg by mouth daily as needed for mild pain or moderate pain.   Yes [provider]  ALPRAZolam Duanne Moron) 0.5 MG tablet Take 0.5 mg by mouth 2 (two) times daily as needed for anxiety. 04/20/19  Yes [provider]  calcium carbonate (CALCIUM-CARB 600) 600 MG TABS tablet Take 1 tablet by mouth daily.   Yes [provider]  Cholecalciferol (VITAMIN D3) 125 MCG (5000 UT) CAPS Take 1 capsule by mouth daily.   Yes [provider]  clotrimazole-betamethasone (LOTRISONE) lotion Apply 1 application topically 2 (two) times daily as needed for rash. 08/05/19  Yes [provider]  fluticasone (FLONASE) 50 MCG/ACT nasal spray Place 1 spray into the nose at bedtime as needed for allergies.   Yes [provider]  glucosamine-chondroitin 500-400 MG tablet Take 2 tablets by mouth.   Yes [provider]  ibuprofen (ADVIL) 200 MG tablet Take 400-600 mg by mouth every 8 (eight) hours as needed for fever or moderate pain.   Yes [provider]  levothyroxine (SYNTHROID) 125 MCG tablet Take 0.5-1 tablets by mouth See admin instructions. Take 1 tablet a day except for Sunday, take 1/2 tablet. 08/19/19  Yes [provider]  omega-3 fish oil (MAXEPA) 1000 MG CAPS capsule Take 2 capsules by mouth daily.   Yes [provider]  psyllium (REGULOID) 0.52 g capsule Take 2 capsules by mouth daily.   Yes [provider]    Physical Exam: Vitals:   09/08/19 1939 09/08/19 2222 09/09/19 0004 09/09/19 0019  BP: (!) 94/35 125/61  (!) 102/51  Pulse: 85 71  65  Resp: 16 18  16   Temp: 99.6 F (37.6 C)  99.3 F (37.4 C)   TempSrc: Oral  Rectal   SpO2: 100% 100%  98%    Constitutional: Lethargic arousable and oriented x3, patient is in distress due to pain. Skin: no rashes, no lesions, poor skin turgor noted. Eyes: Pupils are equally reactive to light.  No evidence of scleral icterus or conjunctival pallor.  ENMT: Dry mucous membranes noted.  Posterior pharynx clear of any exudate or lesions.   Neck: normal, supple, no masses, no thyromegaly.  No evidence of jugular venous distension.   Respiratory: clear to auscultation bilaterally, no wheezing, no crackles. Normal respiratory effort. No accessory muscle use.  Cardiovascular: Regular rate and rhythm, no murmurs / rubs / gallops. No extremity edema. 2+ pedal pulses. No carotid bruits.  Chest:   Nontender without crepitus or deformity.   Back:   Nontender without crepitus or deformity. Abdomen: Significant tenderness of the abdomen in the epigastric, left upper quadrant and right upper quadrant regions.  Abdomen is soft.  No evidence of intra-abdominal masses.  Positive bowel sounds noted in all quadrants.   Musculoskeletal:  No joint deformity upper and lower extremities. Good ROM, no contractures. Normal muscle tone.  Neurologic: Patient is lethargic but arousable.  Patient is moving all 4 extremities spontaneously.  Patient is possible to verbal stimuli.  Patient is able to follow all commands.  Sensation is grossly intact. Psychiatric: Depressed mood with flat affect.  Patient seems to possess insight as to theircurrent situation.     Labs on Admission: I have personally reviewed following labs and imaging studies -   CBC: Recent Labs  Lab 09/08/19 1937  WBC 14.8*  HGB 17.7*  HCT 54.4*  MCV 94.9  PLT 0000000   Basic Metabolic Panel: Recent Labs  Lab 09/08/19 1937  NA 139  K 4.5  CL 104  CO2 21*  GLUCOSE 169*  BUN 11  CREATININE 0.90  CALCIUM 10.0   GFR: CrCl cannot be calculated (Unknown ideal weight.). Liver Function Tests: Recent Labs  Lab 09/08/19 1937  AST 421*  ALT 745*  ALKPHOS 237*  BILITOT 1.3*  PROT 7.8  ALBUMIN 4.5   Recent Labs  Lab 09/08/19 1937  LIPASE 2,881*   No results for input(s): AMMONIA in the last 168 hours. Coagulation Profile: No results for input(s): INR, PROTIME in  the last 168 hours. Cardiac Enzymes: No results for input(s): CKTOTAL, CKMB, CKMBINDEX, TROPONINI in the last 168 hours. BNP (last 3 results) No results for input(s): PROBNP in the last 8760 hours. HbA1C: No results for input(s): HGBA1C in the last 72 hours. CBG: No results for input(s): GLUCAP in the last 168 hours. Lipid Profile: No results for input(s): CHOL, HDL, LDLCALC, TRIG, CHOLHDL, LDLDIRECT in the last 72 hours. Thyroid Function Tests: No results for input(s): TSH, T4TOTAL, FREET4, T3FREE, THYROIDAB in the last 72 hours. Anemia Panel: No results for input(s): VITAMINB12, FOLATE, FERRITIN, TIBC, IRON, RETICCTPCT in the last 72 hours. Urine analysis:    Component Value Date/Time   COLORURINE YELLOW 09/08/2019 1927   APPEARANCEUR CLEAR 09/08/2019 1927   LABSPEC 1.016  09/08/2019 1927   PHURINE 5.0 09/08/2019 1927   GLUCOSEU NEGATIVE 09/08/2019 [place] NEGATIVE 09/08/2019 [place] NEGATIVE 09/08/2019 1927   KETONESUR 20 (A) 09/08/2019 1927   PROTEINUR NEGATIVE 09/08/2019 1927   NITRITE NEGATIVE 09/08/2019 1927   LEUKOCYTESUR TRACE (A) 09/08/2019 1927    Radiological Exams on Admission - Personally Reviewed: CT ABDOMEN PELVIS W CONTRAST  Result Date: 09/08/2019 CLINICAL DATA:  Epigastric pain and nausea EXAM: CT ABDOMEN AND PELVIS WITH CONTRAST TECHNIQUE: Multidetector CT imaging of the abdomen and pelvis was performed using the standard protocol following bolus administration of intravenous contrast. CONTRAST:  122mL OMNIPAQUE IOHEXOL 300 MG/ML  SOLN COMPARISON:  None. FINDINGS: LOWER CHEST: Normal. HEPATOBILIARY: Normal hepatic contours. No intra- or extrahepatic biliary dilatation. The gallbladder is normal. PANCREAS: Large amount of inflammatory stranding adjacent to the body and tail the pancreas. No peripancreatic fluid collection. No parenchymal pancreatic abnormality. Pancreatic duct is normal. SPLEEN: Normal. ADRENALS/URINARY TRACT: The adrenal glands are normal. No hydronephrosis, nephroureterolithiasis or solid renal mass. The urinary bladder is normal for degree of distention STOMACH/BOWEL: There is no hiatal hernia. Normal duodenal course and caliber. No small bowel dilatation or inflammation. No focal colonic abnormality. Normal appendix. VASCULAR/LYMPHATIC: Normal course and caliber of the major abdominal vessels. No abdominal or pelvic lymphadenopathy. REPRODUCTIVE: Status post hysterectomy. No adnexal mass. MUSCULOSKELETAL. No bony spinal canal stenosis or focal osseous abnormality. OTHER: None. IMPRESSION: Acute pancreatitis without peripancreatic fluid collection or evidence of pancreatic necrosis. Electronically Signed   By: Ulyses Jarred M.D.   On: 09/08/2019 23:17   US Abdomen Limited RUQ  Result Date: 09/08/2019 CLINICAL DATA:   Right upper quadrant pain EXAM: ULTRASOUND ABDOMEN LIMITED RIGHT UPPER QUADRANT COMPARISON:  None. FINDINGS: Gallbladder: Multiple gallstones. Positive sonographic Percell Miller sign was reported by the sonographer. No gallbladder wall thickening or pericholecystic fluid. Common bile duct: Diameter: 4 mm Liver: No focal lesion identified. Within normal limits in parenchymal echogenicity. Portal vein is patent on color Doppler imaging with normal direction of blood flow towards the liver. Other: None. IMPRESSION: Cholelithiasis with a positive sonographic Murphy sign. This is compatible with acute cholecystitis in the appropriate context. However, the findings on the concomitant CT of the abdomen and pelvis are more consistent with acute pancreatitis. Electronically Signed   By: Ulyses Jarred M.D.   On: 09/08/2019 23:22    EKG: Personally reviewed.  Rhythm is normal sinus rhythm with heart rate of 76 bpm.  No dynamic ST segment changes appreciated.  Assessment/Plan Principal Problem:   Acute gallstone pancreatitis   Patient presenting with markedly elevated lipase, epigastric pain, epigastric tenderness on examination and CT evidence of acute pancreatitis  Due to elevated transaminases, right upper quad ultrasound was obtained  revealing positive Murphy sign concerning for concurrent acute cholecystitis as well as choledocholithiasis  Case was discussed with Dr.Armbruster who recommended MRCP which has been ordered.  He recommended abstaining from calling general surgery at this time until the MRCP results can be reviewed and his team can evaluate the patient in the morning.  Patient has been initiated on intravenous Zosyn in the meantime due to concerns for acute cholecystitis  Patient will be kept n.p.o.  During patient aggressively with intravenous isotonic fluids  As needed intravenous antiemetic  As needed intravenous opiate-based analgesics for substantial associated pain  Concerning other  possible etiologies  -patient denies alcohol use.  Obtaining lipid panel.  Obtaining toxicology screen.   Active Problems:   Acute cholecystitis   Please see assessment and plan above    Hypothyroidism, postsurgical  While patient is n.p.o., will provide patient with intravenous Synthroid with 50% dose reduction due to intravenous dosing    Code Status:  Full code Family Communication: Family at bedside has been updated on plan of care  Status is: Inpatient  Remains inpatient appropriate because:Ongoing active pain requiring inpatient pain management, Ongoing diagnostic testing needed not appropriate for outpatient work up, IV treatments appropriate due to intensity of illness or inability to take PO and Inpatient level of care appropriate due to severity of illness   Dispo: The patient is from: Home              Anticipated d/c is to: Home              Anticipated d/c date is: > 3 days              Patient currently is not medically stable to d/c.         Vernelle Emerald MD [facility] Pager [phone]  If 7PM-7AM, please contact night-coverage [email or web address] Use universal [employer] password for that web site. If you do not have the password, please call the hospital operator.  09/09/2019, 1:53 AM

## 2019-09-09 NOTE — Consult Note (Addendum)
[facility] Consult: 8:15 AM 09/09/2019  LOS: 0 days    Referring Provider: Dr Horris Latino  Primary Care Physician:  Nona Dell, Corene Cornea, MD in [place] Primary Gastroenterologist: unassigned     Reason for Consultation:  (Biliary ?)pancreatitis   HPI: Jordan Pollard is a 49 y.o. female.  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  S/p partial hysterectomy, tubes and ovaries intact.. S/p left knee ACL repair.   For several months patient has had episodes of pain across her upper abdomen and radiating around to the back almost in a belt-like pattern but no nausea, vomiting, anorexia.  These would happen about once a month and last many hours.  Often occurring at night, she would awaken in the morning with resolved symptoms.  Episodes have become more frequent.  She has tried Tums, ibuprofen, Gas-X, Prilosec, Tylenol and it is hard to say if they were helpful.  She had another bout over the weekend through Monday with persistent pain, some chills but no fever.  She sought medical attention yesterday. The only episodes of nausea and vomiting were after receiving narcotics once she arrived at the hospital.  This morning the pain is much improved.  Denies dyspnea, diarrhea. Lipase 2881.  T bili 1.3 >> 1.1.  Alk phos 237 >> 174.  AST/ALT 421/745 >> 190/483.   TGs 34.   WBCs 14.8 >> 11.2.   Covid 19 negative.    CTAP w contrast.  Normal liver, normal biliary tree.  Normal GB stranding in region of pancreatic body and tail without fluid collection or necrosis.  PD normal Abdominal ultrasound: Cholelithiasis, positive Murphy sign C/W acute cholecystitis in appropriate context.  Though CT findings C/W acute pancreatitis.  PV patent with normal directional flow. MRI abdomen/MRCP.  Acute  pancreatitis in the body and tail extending to mesentery along left pararenal space.  No fluid collections, no necrosis.  No variant PD anatomy.  Multiple large and small stones at the neck of gallbladder without clear evidence of cholecystitis.  No choledocholithiasis.  Social history Patient is well educated.  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  She got her PhD in psychology and works as an adjunct professor in psychology at [employer]  No smoking, no drinking. Family history No known GB, liver, pancreatic disease.  Her paternal grandfather died with cancer that was discovered as widespread abdominal mets, unknown primary.  Past Medical History:  Diagnosis Date  . Papillary thyroid carcinoma ([place]) 2016   S/P thyroidectomy  . Post-surgical hypothyroidism     History reviewed. No pertinent surgical history.  Prior to Admission medications   Medication Sig Start Date End Date Taking? Authorizing Provider  acetaminophen (TYLENOL) 500 MG tablet Take 1,000 mg by mouth daily as needed for mild pain or moderate pain.   Yes [provider]  ALPRAZolam Duanne Moron) 0.5 MG tablet Take 0.5 mg by mouth 2 (two) times daily as needed for anxiety. 04/20/19  Yes [provider]  calcium carbonate (CALCIUM-CARB 600) 600 MG TABS tablet Take 1 tablet by mouth  daily.   Yes [provider]  Cholecalciferol (VITAMIN D3) 125 MCG (5000 UT) CAPS Take 1 capsule by mouth daily.   Yes [provider]  clotrimazole-betamethasone (LOTRISONE) lotion Apply 1 application topically 2 (two) times daily as needed for rash. 08/05/19  Yes [provider]  fluticasone (FLONASE) 50 MCG/ACT nasal spray Place 1 spray into the nose at bedtime as needed for allergies.   Yes [provider]  glucosamine-chondroitin 500-400 MG tablet Take 2 tablets by mouth.   Yes [provider]  ibuprofen (ADVIL) 200 MG tablet Take 400-600 mg by mouth every 8 (eight)  hours as needed for fever or moderate pain.   Yes [provider]  levothyroxine (SYNTHROID) 125 MCG tablet Take 0.5-1 tablets by mouth See admin instructions. Take 1 tablet a day except for Sunday, take 1/2 tablet. 08/19/19  Yes [provider]  omega-3 fish oil (MAXEPA) 1000 MG CAPS capsule Take 2 capsules by mouth daily.   Yes [provider]  psyllium (REGULOID) 0.52 g capsule Take 2 capsules by mouth daily.   Yes [provider]    Scheduled Meds: . enoxaparin (LOVENOX) injection  40 mg Subcutaneous Daily  . [START ON 09/10/2019] levothyroxine  62.5 mcg Intravenous Daily  . sodium chloride flush  3 mL Intravenous Once   Infusions: . lactated ringers 125 mL/hr at 09/09/19 0157  . piperacillin-tazobactam (ZOSYN)  IV 3.375 g (09/09/19 0612)   PRN Meds: acetaminophen **OR** acetaminophen, HYDROmorphone (DILAUDID) injection **OR** HYDROmorphone (DILAUDID) injection, ondansetron (ZOFRAN) IV   Allergies as of 09/08/2019  . (No Known Allergies)    Family History  Problem Relation Age of Onset  . Colon cancer Paternal Grandfather     Social History   Socioeconomic History  . Marital status: Single    Spouse name: Not on file  . Number of children: Not on file  . Years of education: Not on file  . Highest education level: Not on file  Occupational History  . Not on file  Tobacco Use  . Smoking status: Never Smoker  . Smokeless tobacco: Never Used  Substance and Sexual Activity  . Alcohol use: Not on file  . Drug use: Not on file  . Sexual activity: Not on file  Other Topics Concern  . Not on file  Social History Narrative  . Not on file   Social Determinants of Health   Financial Resource Strain:   . Difficulty of Paying Living Expenses:   Food Insecurity:   . Worried About Charity fundraiser in the Last Year:   . Arboriculturist in the Last Year:   Transportation Needs:   . Film/video editor (Medical):   Marland Kitchen Lack of  Transportation (Non-Medical):   Physical Activity:   . Days of Exercise per Week:   . Minutes of Exercise per Session:   Stress:   . Feeling of Stress :   Social Connections:   . Frequency of Communication with Friends and Family:   . Frequency of Social Gatherings with Friends and Family:   . Attends Religious Services:   . Active Member of Clubs or Organizations:   . Attends Archivist Meetings:   Marland Kitchen Marital Status:   Intimate Partner Violence:   . Fear of Current or Ex-Partner:   . Emotionally Abused:   Marland Kitchen Physically Abused:   . Sexually Abused:     REVIEW OF SYSTEMS: Constitutional: Generally no fatigue, weakness or limits to activity other than  knee pain limits vigorous exercise.  However she works out at [employer] a couple of times a week and walks for 30 to 60 minutes several times weekly. ENT:  No nose bleeds Pulm: No shortness of breath or cough. CV:  No palpitations, no LE edema.  No chest pain. GU:  No hematuria, no frequency.  No dark urine.  Since arrival in ED and starting IV fluids, patient is urinating but not in abundance. GI: See HPI.  Generally does not have much reflux/heartburn. Heme: No unusual or excessive bleeding or bruising Transfusions: None. Neuro:  No headaches, no peripheral tingling or numbness Derm:  No itching, no rash or sores.  Endocrine:  No sweats or chills.  No polyuria or dysuria Immunization: Not queried   PHYSICAL EXAM: Vital signs in last 24 hours: Vitals:   09/09/19 0306 09/09/19 0432  BP: 116/76 114/77  Pulse: 84 90  Resp: 16 16  Temp:  98.3 F (36.8 C)  SpO2: 99% 95%   Wt Readings from Last 3 Encounters:  No data found for Wt    General: Pleasant, well-appearing, comfortable. Head: No facial asymmetry or swelling.  No signs of head trauma. Eyes: No scleral icterus or conjunctival pallor.  EOMI Ears: No hearing deficit Nose: No discharge or congestion Mouth: Moist, clear, pink mucosa.  Good dentition.  Tongue  midline. Neck: No JVD, no masses. Lungs: Clear bilaterally with good breath sounds. Heart: RRR.  No MRG.  S1, S2 present Abdomen: Soft, nondistended, active bowel sounds.  Mild to moderate tenderness across the upper abdomen, most prominent at LUQ.   Rectal: Deferred Musc/Skeltl: No joint redness, swelling, gross deformity. Extremities: No CCE. Neurologic: Oriented x3.  Good historian.  Alert.  Moves all 4 limbs, strength not tested but no gross deficits Skin: No rash, no sores, no jaundice, no telangiectasia Tattoos: None observed Nodes: No cervical adenopathy Psych: Calm, cooperative, pleasant, fluid speech.  Intake/Output from previous day: 05/11 0701 - 05/12 0700 In: 356.3 [I.V.:356.3] Out: -  Intake/Output this shift: No intake/output data recorded.  LAB RESULTS: Recent Labs    09/08/19 1937 09/09/19 0638  WBC 14.8* 11.2*  HGB 17.7* 14.9  HCT 54.4* 45.9  PLT 261 229   BMET Lab Results  Component Value Date   NA 141 09/09/2019   NA 139 09/08/2019   K 3.9 09/09/2019   K 4.5 09/08/2019   CL 107 09/09/2019   CL 104 09/08/2019   CO2 21 (L) 09/09/2019   CO2 21 (L) 09/08/2019   GLUCOSE 122 (H) 09/09/2019   GLUCOSE 169 (H) 09/08/2019   BUN 10 09/09/2019   BUN 11 09/08/2019   CREATININE 0.78 09/09/2019   CREATININE 0.90 09/08/2019   CALCIUM 8.6 (L) 09/09/2019   CALCIUM 10.0 09/08/2019   LFT Recent Labs    09/08/19 1937 09/09/19 0638  PROT 7.8 6.0*  ALBUMIN 4.5 3.4*  AST 421* 190*  ALT 745* 483*  ALKPHOS 237* 174*  BILITOT 1.3* 1.1   PT/INR No results found for: INR, PROTIME Hepatitis Panel No results for input(s): HEPBSAG, HCVAB, HEPAIGM, HEPBIGM in the last 72 hours. C-Diff No components found for: CDIFF Lipase     Component Value Date/Time   LIPASE 2,881 (H) 09/08/2019 1937    Drugs of Abuse     Component Value Date/Time   LABOPIA NONE DETECTED 09/08/2019 1949   COCAINSCRNUR NONE DETECTED 09/08/2019 1949   LABBENZ NONE DETECTED 09/08/2019  1949   AMPHETMU NONE DETECTED 09/08/2019 1949   THCU  NONE DETECTED 09/08/2019 1949   LABBARB NONE DETECTED 09/08/2019 1949     RADIOLOGY STUDIES: CT ABDOMEN PELVIS W CONTRAST  Result Date: 09/08/2019 CLINICAL DATA:  Epigastric pain and nausea EXAM: CT ABDOMEN AND PELVIS WITH CONTRAST TECHNIQUE: Multidetector CT imaging of the abdomen and pelvis was performed using the standard protocol following bolus administration of intravenous contrast. CONTRAST:  184m OMNIPAQUE IOHEXOL 300 MG/ML  SOLN COMPARISON:  None. FINDINGS: LOWER CHEST: Normal. HEPATOBILIARY: Normal hepatic contours. No intra- or extrahepatic biliary dilatation. The gallbladder is normal. PANCREAS: Large amount of inflammatory stranding adjacent to the body and tail the pancreas. No peripancreatic fluid collection. No parenchymal pancreatic abnormality. Pancreatic duct is normal. SPLEEN: Normal. ADRENALS/URINARY TRACT: The adrenal glands are normal. No hydronephrosis, nephroureterolithiasis or solid renal mass. The urinary bladder is normal for degree of distention STOMACH/BOWEL: There is no hiatal hernia. Normal duodenal course and caliber. No small bowel dilatation or inflammation. No focal colonic abnormality. Normal appendix. VASCULAR/LYMPHATIC: Normal course and caliber of the major abdominal vessels. No abdominal or pelvic lymphadenopathy. REPRODUCTIVE: Status post hysterectomy. No adnexal mass. MUSCULOSKELETAL. No bony spinal canal stenosis or focal osseous abnormality. OTHER: None. IMPRESSION: Acute pancreatitis without peripancreatic fluid collection or evidence of pancreatic necrosis. Electronically Signed   By: KUlyses JarredM.D.   On: 09/08/2019 23:17   MR ABDOMEN MRCP W WO CONTAST  Result Date: 09/09/2019 CLINICAL DATA:  Epigastric pain and nausea for 2 days. EXAM: MRI ABDOMEN WITHOUT AND WITH CONTRAST (INCLUDING MRCP) TECHNIQUE: Multiplanar multisequence MR imaging of the abdomen was performed both before and after the  administration of intravenous contrast. Heavily T2-weighted images of the biliary and pancreatic ducts were obtained, and three-dimensional MRCP images were rendered by post processing. CONTRAST:  7.589mGADAVIST GADOBUTROL 1 MMOL/ML IV SOLN COMPARISON:  Ultrasound and CT 09/08/1998 FINDINGS: Lower chest:  Lung bases are clear. Hepatobiliary: Several large gallstones and multiple small gallstones collect in the neck of the gallbladder. Gallbladder measures 3.7 cm diameter. No gallbladder wall thickening. No comparison cholecystic fluid. No clear gallbladder inflammation. The common hepatic duct and common bile duct are prominent and mildly dilated with the common bile duct measuring 8 mm in diameter. No filling defects within the common bile duct. Common bile duct is normal caliber through the pancreatic head measuring 4 mm and tapers smoothly to the ampulla (image 23/series 14/MRCP sequence) Postcontrast enhanced imaging demonstrates no hepatic lesion. Pancreas: Mild edema in the body and tail of the pancreas. Pancreatic duct is normal caliber. There is no variant pancreatic ductal anatomy. Postcontrast imaging demonstrates uniform enhancement of the pancreas. On T2 weighted imaging there is peripancreatic fluid (image 6/series 16). This fluid extends along the body and tail primarily. Small amount of fluid also collects in the anterior to the LEFT pararenal space. Edema extends into the central mesentery also (image 29/series 4). Spleen: Normal spleen. Adrenals/urinary tract: Adrenal glands and kidneys are normal. Stomach/Bowel: Stomach and limited of the small bowel is unremarkable Vascular/Lymphatic: No upper abdominal adenopathy.  Abdominal aorta Musculoskeletal: No aggressive osseous lesion IMPRESSION: 1. Findings consistent acute pancreatitis with edema surrounding the body and tail the pancreas and extending into the mesentery along the LEFT pararenal space. No organized fluid collections. No pancreatic  necrosis. 2. No variant pancreatic ductal anatomy. 3. Multiple large and small gallstones collect towards the neck of the gallbladder however no clear evidence of acute cholecystitis. No choledocholithiasis. 4. Potential gallstone pancreatitis with passage a gallstone through the common bile duct. Other etiologies of pancreatitis should  also be considered. Electronically Signed   By: Suzy Bouchard M.D.   On: 09/09/2019 07:48   US Abdomen Limited RUQ  Result Date: 09/08/2019 CLINICAL DATA:  Right upper quadrant pain EXAM: ULTRASOUND ABDOMEN LIMITED RIGHT UPPER QUADRANT COMPARISON:  None. FINDINGS: Gallbladder: Multiple gallstones. Positive sonographic Percell Miller sign was reported by the sonographer. No gallbladder wall thickening or pericholecystic fluid. Common bile duct: Diameter: 4 mm Liver: No focal lesion identified. Within normal limits in parenchymal echogenicity. Portal vein is patent on color Doppler imaging with normal direction of blood flow towards the liver. Other: None. IMPRESSION: Cholelithiasis with a positive sonographic Murphy sign. This is compatible with acute cholecystitis in the appropriate context. However, the findings on the concomitant CT of the abdomen and pelvis are more consistent with acute pancreatitis. Electronically Signed   By: Ulyses Jarred M.D.   On: 09/08/2019 23:22     IMPRESSION:   *   Acute pancreatitis, presumed biliary in origin.  However no current evidence of choledocholithiasis and no dilation of bile ducts.  Triglycerides are normal and no regular meds particular culprits for pancreatitis.Marland Kitchen    PLAN:     *   Obtain surgical evaluation for cholecystectomy.  *   Allow clear liquids.   *   Cmet, lipase in AM.    *   Increase lactated Ringer's from 125/hour up to 200/hour for 5 hours and then dropped to 150/hour.   Azucena Freed  09/09/2019, 8:15 AM Phone [phone]  I have reviewed the entire case in detail with the above APP and discussed the  plan in detail.  Therefore, I agree with the diagnoses recorded above. In addition,  I have personally interviewed and examined the patient and have personally reviewed any abdominal/pelvic CT scan images.  My additional thoughts are as follows: (A friend was present at the bedside for entire visit, and her parents were on speaker phone the entire visit as well) PA from the surgical service was there seeing patient when I arrived, discussing cholecystectomy.  Acute biliary pancreatitis. Moderate tenderness on exam, no ileus or pulmonary edema. She appears uncomfortable, but nontoxic. Alert and conversational.  Symptomatic cholelithiasis for several months  Elevated LFTs. Most likely transient choledocholithiasis resulting in pancreatitis, no persistent choledocholithiasis seen on MRI/MRCP.   Recommend sips of clear liquid diet, IV fluid support (rate was increased to 200 cc an hour at least until tomorrow), evaluation by surgical attending to determine timing of cholecystectomy. She is on Zosyn in case there is any low-grade cholecystitis not evident on imaging, though I suspect the modest leukocytosis is from pancreatitis. Pancreatitis should settle down some more before cholecystectomy, perhaps in a couple of days. I recommend IOC unless LFTs completely normalized by the time of cholecystectomy.  Repeat CBC, c-Met and lipase tomorrow morning.  Nature of pancreatitis, findings on labs and imaging and expected clinical course reviewed with patient. All questions answered.  Nelida Meuse III Office:[phone]

## 2019-09-10 ENCOUNTER — Encounter (HOSPITAL_COMMUNITY)
Admission: EM | Disposition: A | Discharge status: Home / Self Care | Payer: Self-pay | Source: Home / Self Care | Attending: Internal Medicine

## 2019-09-10 ENCOUNTER — Inpatient Hospital Stay (HOSPITAL_COMMUNITY): Payer: BC Managed Care – PPO | Admitting: Anesthesiology

## 2019-09-10 ENCOUNTER — Encounter (HOSPITAL_COMMUNITY): Payer: Self-pay | Admitting: Internal Medicine

## 2019-09-10 ENCOUNTER — Inpatient Hospital Stay (HOSPITAL_COMMUNITY): Payer: BC Managed Care – PPO

## 2019-09-10 DIAGNOSIS — E890 Postprocedural hypothyroidism: Secondary | ICD-10-CM | POA: Diagnosis not present

## 2019-09-10 DIAGNOSIS — R1011 Right upper quadrant pain: Secondary | ICD-10-CM | POA: Diagnosis not present

## 2019-09-10 DIAGNOSIS — K8010 Calculus of gallbladder with chronic cholecystitis without obstruction: Secondary | ICD-10-CM | POA: Diagnosis not present

## 2019-09-10 DIAGNOSIS — K810 Acute cholecystitis: Secondary | ICD-10-CM | POA: Diagnosis not present

## 2019-09-10 DIAGNOSIS — K915 Postcholecystectomy syndrome: Secondary | ICD-10-CM | POA: Diagnosis not present

## 2019-09-10 DIAGNOSIS — K8062 Calculus of gallbladder and bile duct with acute cholecystitis without obstruction: Secondary | ICD-10-CM | POA: Diagnosis not present

## 2019-09-10 DIAGNOSIS — K851 Biliary acute pancreatitis without necrosis or infection: Secondary | ICD-10-CM | POA: Diagnosis not present

## 2019-09-10 HISTORY — PX: CHOLECYSTECTOMY: SHX55

## 2019-09-10 LAB — CBC WITH DIFFERENTIAL/PLATELET
Abs Immature Granulocytes: 0.03 10*3/uL (ref 0.00–0.07)
Basophils Absolute: 0.1 10*3/uL (ref 0.0–0.1)
Basophils Relative: 1 %
Eosinophils Absolute: 0.2 10*3/uL (ref 0.0–0.5)
Eosinophils Relative: 2 %
HCT: 39.6 % (ref 36.0–46.0)
Hemoglobin: 13 g/dL (ref 12.0–15.0)
Immature Granulocytes: 0 %
Lymphocytes Relative: 18 %
Lymphs Abs: 1.8 10*3/uL (ref 0.7–4.0)
MCH: 30.8 pg (ref 26.0–34.0)
MCHC: 32.8 g/dL (ref 30.0–36.0)
MCV: 93.8 fL (ref 80.0–100.0)
Monocytes Absolute: 0.6 10*3/uL (ref 0.1–1.0)
Monocytes Relative: 6 %
Neutro Abs: 7.7 10*3/uL (ref 1.7–7.7)
Neutrophils Relative %: 73 %
Platelets: 213 10*3/uL (ref 150–400)
RBC: 4.22 MIL/uL (ref 3.87–5.11)
RDW: 14.3 % (ref 11.5–15.5)
WBC: 10.4 10*3/uL (ref 4.0–10.5)
nRBC: 0 % (ref 0.0–0.2)

## 2019-09-10 LAB — COMPREHENSIVE METABOLIC PANEL
ALT: 252 U/L — ABNORMAL HIGH (ref 0–44)
AST: 63 U/L — ABNORMAL HIGH (ref 15–41)
Albumin: 2.8 g/dL — ABNORMAL LOW (ref 3.5–5.0)
Alkaline Phosphatase: 122 U/L (ref 38–126)
Anion gap: 11 (ref 5–15)
BUN: <5 mg/dL — ABNORMAL LOW (ref 6–20)
CO2: 23 mmol/L (ref 22–32)
Calcium: 8.3 mg/dL — ABNORMAL LOW (ref 8.9–10.3)
Chloride: 107 mmol/L (ref 98–111)
Creatinine, Ser: 0.73 mg/dL (ref 0.44–1.00)
GFR calc Af Amer: >60 mL/min (ref >60)
GFR calc non Af Amer: >60 mL/min (ref >60)
Glucose, Bld: 106 mg/dL — ABNORMAL HIGH (ref 70–99)
Potassium: 3.8 mmol/L (ref 3.5–5.1)
Sodium: 141 mmol/L (ref 135–145)
Total Bilirubin: 0.9 mg/dL (ref 0.3–1.2)
Total Protein: 5 g/dL — ABNORMAL LOW (ref 6.5–8.1)

## 2019-09-10 LAB — MAGNESIUM: Magnesium: 1.7 mg/dL (ref 1.7–2.4)

## 2019-09-10 LAB — LIPASE, BLOOD: Lipase: 73 U/L — ABNORMAL HIGH (ref 11–51)

## 2019-09-10 LAB — SURGICAL PCR SCREEN
MRSA, PCR: NEGATIVE
Staphylococcus aureus: POSITIVE — AB

## 2019-09-10 SURGERY — LAPAROSCOPIC CHOLECYSTECTOMY WITH INTRAOPERATIVE CHOLANGIOGRAM
Anesthesia: General | Site: Abdomen

## 2019-09-10 MED ORDER — FENTANYL CITRATE (PF) 250 MCG/5ML IJ SOLN
INTRAMUSCULAR | Status: DC | PRN
  Administered 2019-09-10: 50 ug via INTRAVENOUS
  Administered 2019-09-10: 100 ug via INTRAVENOUS

## 2019-09-10 MED ORDER — SCOPOLAMINE 1 MG/3DAYS TD PT72
MEDICATED_PATCH | TRANSDERMAL | Status: AC
  Filled 2019-09-10: qty 2

## 2019-09-10 MED ORDER — CELECOXIB 200 MG PO CAPS
200.0000 mg | ORAL_CAPSULE | Freq: Two times a day (BID) | ORAL | Status: DC
  Administered 2019-09-10 – 2019-09-11 (×2): 200 mg via ORAL
  Filled 2019-09-10 (×2): qty 1

## 2019-09-10 MED ORDER — KETOROLAC TROMETHAMINE 30 MG/ML IJ SOLN
INTRAMUSCULAR | Status: AC
  Filled 2019-09-10: qty 1

## 2019-09-10 MED ORDER — PROPOFOL 10 MG/ML IV BOLUS
INTRAVENOUS | Status: DC | PRN
  Administered 2019-09-10: 200 mg via INTRAVENOUS

## 2019-09-10 MED ORDER — DEXAMETHASONE SODIUM PHOSPHATE 10 MG/ML IJ SOLN
INTRAMUSCULAR | Status: DC | PRN
  Administered 2019-09-10: 10 mg via INTRAVENOUS

## 2019-09-10 MED ORDER — ONDANSETRON HCL 4 MG/2ML IJ SOLN
INTRAMUSCULAR | Status: DC | PRN
  Administered 2019-09-10: 4 mg via INTRAVENOUS

## 2019-09-10 MED ORDER — OXYCODONE HCL 5 MG PO TABS: 5.0000 mg | ORAL_TABLET | Freq: Once | ORAL | Status: DC | PRN

## 2019-09-10 MED ORDER — FENTANYL CITRATE (PF) 250 MCG/5ML IJ SOLN
INTRAMUSCULAR | Status: AC
  Filled 2019-09-10: qty 5

## 2019-09-10 MED ORDER — OXYCODONE HCL 5 MG PO TABS: 5.0000 mg | ORAL_TABLET | ORAL | Status: DC | PRN

## 2019-09-10 MED ORDER — MORPHINE SULFATE (PF) 2 MG/ML IV SOLN: 1.0000 mg | INTRAVENOUS | Status: DC | PRN

## 2019-09-10 MED ORDER — LIDOCAINE 2% (20 MG/ML) 5 ML SYRINGE
INTRAMUSCULAR | Status: DC | PRN
  Administered 2019-09-10: 30 mg via INTRAVENOUS

## 2019-09-10 MED ORDER — ROCURONIUM BROMIDE 100 MG/10ML IV SOLN
INTRAVENOUS | Status: DC | PRN
  Administered 2019-09-10: 20 mg via INTRAVENOUS
  Administered 2019-09-10: 5 mg via INTRAVENOUS
  Administered 2019-09-10: 40 mg via INTRAVENOUS

## 2019-09-10 MED ORDER — SODIUM CHLORIDE 0.9 % IR SOLN
Status: DC | PRN
  Administered 2019-09-10: 1000 mL

## 2019-09-10 MED ORDER — PHENYLEPHRINE 40 MCG/ML (10ML) SYRINGE FOR IV PUSH (FOR BLOOD PRESSURE SUPPORT)
PREFILLED_SYRINGE | INTRAVENOUS | Status: DC | PRN
  Administered 2019-09-10: 80 ug via INTRAVENOUS

## 2019-09-10 MED ORDER — EPHEDRINE SULFATE-NACL 50-0.9 MG/10ML-% IV SOSY
PREFILLED_SYRINGE | INTRAVENOUS | Status: DC | PRN
  Administered 2019-09-10: 5 mg via INTRAVENOUS
  Administered 2019-09-10: 10 mg via INTRAVENOUS
  Administered 2019-09-10: 5 mg via INTRAVENOUS

## 2019-09-10 MED ORDER — ACETAMINOPHEN 500 MG PO TABS
1000.0000 mg | ORAL_TABLET | Freq: Four times a day (QID) | ORAL | Status: DC
  Administered 2019-09-10 – 2019-09-11 (×4): 1000 mg via ORAL
  Filled 2019-09-10 (×4): qty 2

## 2019-09-10 MED ORDER — MIDAZOLAM HCL 5 MG/5ML IJ SOLN
INTRAMUSCULAR | Status: DC | PRN
  Administered 2019-09-10: 2 mg via INTRAVENOUS

## 2019-09-10 MED ORDER — ACETAMINOPHEN 10 MG/ML IV SOLN
INTRAVENOUS | Status: AC
  Filled 2019-09-10: qty 100

## 2019-09-10 MED ORDER — SODIUM CHLORIDE 0.9 % IV SOLN: INTRAVENOUS | Status: DC | PRN

## 2019-09-10 MED ORDER — 0.9 % SODIUM CHLORIDE (POUR BTL) OPTIME
TOPICAL | Status: DC | PRN
  Administered 2019-09-10: 1000 mL

## 2019-09-10 MED ORDER — BUPIVACAINE HCL (PF) 0.25 % IJ SOLN
INTRAMUSCULAR | Status: AC
  Filled 2019-09-10: qty 30

## 2019-09-10 MED ORDER — FENTANYL CITRATE (PF) 100 MCG/2ML IJ SOLN: 25.0000 ug | INTRAMUSCULAR | Status: DC | PRN

## 2019-09-10 MED ORDER — MIDAZOLAM HCL 2 MG/2ML IJ SOLN
INTRAMUSCULAR | Status: AC
  Filled 2019-09-10: qty 2

## 2019-09-10 MED ORDER — ENOXAPARIN SODIUM 40 MG/0.4ML ~~LOC~~ SOLN: 40.0000 mg | SUBCUTANEOUS | Status: DC

## 2019-09-10 MED ORDER — SUCCINYLCHOLINE CHLORIDE 200 MG/10ML IV SOSY
PREFILLED_SYRINGE | INTRAVENOUS | Status: AC
  Filled 2019-09-10: qty 10

## 2019-09-10 MED ORDER — PROMETHAZINE HCL 25 MG/ML IJ SOLN: 6.2500 mg | INTRAMUSCULAR | Status: DC | PRN

## 2019-09-10 MED ORDER — EPHEDRINE 5 MG/ML INJ
INTRAVENOUS | Status: AC
  Filled 2019-09-10: qty 10

## 2019-09-10 MED ORDER — SUGAMMADEX SODIUM 200 MG/2ML IV SOLN
INTRAVENOUS | Status: DC | PRN
  Administered 2019-09-10: 200 mg via INTRAVENOUS

## 2019-09-10 MED ORDER — LACTATED RINGERS IV SOLN: INTRAVENOUS | Status: DC | PRN

## 2019-09-10 MED ORDER — PROPOFOL 500 MG/50ML IV EMUL
INTRAVENOUS | Status: DC | PRN
  Administered 2019-09-10: 30 ug/kg/min via INTRAVENOUS

## 2019-09-10 MED ORDER — SODIUM CHLORIDE 0.9 % IV SOLN
INTRAVENOUS | Status: DC | PRN
  Administered 2019-09-10: 15 mL

## 2019-09-10 MED ORDER — ACETAMINOPHEN 10 MG/ML IV SOLN
INTRAVENOUS | Status: DC | PRN
  Administered 2019-09-10: 1000 mg via INTRAVENOUS

## 2019-09-10 MED ORDER — PHENYLEPHRINE 40 MCG/ML (10ML) SYRINGE FOR IV PUSH (FOR BLOOD PRESSURE SUPPORT)
PREFILLED_SYRINGE | INTRAVENOUS | Status: AC
  Filled 2019-09-10: qty 20

## 2019-09-10 MED ORDER — SCOPOLAMINE 1 MG/3DAYS TD PT72
MEDICATED_PATCH | TRANSDERMAL | Status: DC | PRN
  Administered 2019-09-10: 1 via TRANSDERMAL

## 2019-09-10 MED ORDER — SUCCINYLCHOLINE CHLORIDE 20 MG/ML IJ SOLN
INTRAMUSCULAR | Status: DC | PRN
  Administered 2019-09-10: 120 mg via INTRAVENOUS

## 2019-09-10 MED ORDER — BUPIVACAINE HCL 0.25 % IJ SOLN
INTRAMUSCULAR | Status: DC | PRN
  Administered 2019-09-10: 30 mL

## 2019-09-10 MED ORDER — KETOROLAC TROMETHAMINE 30 MG/ML IJ SOLN
INTRAMUSCULAR | Status: DC | PRN
  Administered 2019-09-10: 30 mg via INTRAVENOUS

## 2019-09-10 MED ORDER — OXYCODONE HCL 5 MG/5ML PO SOLN: 5.0000 mg | Freq: Once | ORAL | Status: DC | PRN

## 2019-09-10 MED ORDER — LACTATED RINGERS IV SOLN: INTRAVENOUS | Status: DC

## 2019-09-10 SURGICAL SUPPLY — 42 items
APPLIER CLIP ROT 10 11.4 M/L (STAPLE) ×2
BLADE CLIPPER SURG (BLADE) IMPLANT
CANISTER SUCT 3000ML PPV (MISCELLANEOUS) ×2 IMPLANT
CATH CHOLANG 76X19 KUMAR (CATHETERS) ×1 IMPLANT
CHLORAPREP W/TINT 26 (MISCELLANEOUS) ×2 IMPLANT
CLIP APPLIE ROT 10 11.4 M/L (STAPLE) IMPLANT
CLIP VESOLOCK MED LG 6/CT (CLIP) IMPLANT
COVER MAYO STAND STRL (DRAPES) ×1 IMPLANT
COVER SURGICAL LIGHT HANDLE (MISCELLANEOUS) ×2 IMPLANT
COVER WAND RF STERILE (DRAPES) ×1 IMPLANT
DERMABOND ADVANCED (GAUZE/BANDAGES/DRESSINGS) ×1
DERMABOND ADVANCED .7 DNX12 (GAUZE/BANDAGES/DRESSINGS) ×1 IMPLANT
DRAPE C-ARM 42X120 X-RAY (DRAPES) ×2 IMPLANT
ELECT REM PT RETURN 9FT ADLT (ELECTROSURGICAL) ×2
ELECTRODE REM PT RTRN 9FT ADLT (ELECTROSURGICAL) ×1 IMPLANT
GLOVE BIOGEL PI IND STRL 7.0 (GLOVE) ×1 IMPLANT
GLOVE BIOGEL PI INDICATOR 7.0 (GLOVE) ×1
GLOVE SURG SS PI 7.0 STRL IVOR (GLOVE) ×2 IMPLANT
GOWN STRL REUS W/ TWL LRG LVL3 (GOWN DISPOSABLE) ×3 IMPLANT
GOWN STRL REUS W/TWL LRG LVL3 (GOWN DISPOSABLE) ×4
GRASPER SUT TROCAR 14GX15 (MISCELLANEOUS) ×2 IMPLANT
KIT BASIN OR (CUSTOM PROCEDURE TRAY) ×2 IMPLANT
KIT TURNOVER KIT B (KITS) ×2 IMPLANT
NEEDLE 22X1 1/2 (OR ONLY) (NEEDLE) ×2 IMPLANT
NS IRRIG 1000ML POUR BTL (IV SOLUTION) ×2 IMPLANT
PAD ARMBOARD 7.5X6 YLW CONV (MISCELLANEOUS) ×2 IMPLANT
POUCH RETRIEVAL ECOSAC 10 (ENDOMECHANICALS) ×1 IMPLANT
POUCH RETRIEVAL ECOSAC 10MM (ENDOMECHANICALS) ×1
SCISSORS LAP 5X35 DISP (ENDOMECHANICALS) ×2 IMPLANT
SET CHOLANGIOGRAPH 5 50 .035 (SET/KITS/TRAYS/PACK) ×1 IMPLANT
SET IRRIG TUBING LAPAROSCOPIC (IRRIGATION / IRRIGATOR) ×2 IMPLANT
SET TUBE SMOKE EVAC HIGH FLOW (TUBING) ×2 IMPLANT
SLEEVE ENDOPATH XCEL 5M (ENDOMECHANICALS) ×4 IMPLANT
SPECIMEN JAR SMALL (MISCELLANEOUS) ×2 IMPLANT
STOPCOCK 4 WAY LG BORE MALE ST (IV SETS) ×1 IMPLANT
SUT MNCRL AB 4-0 PS2 18 (SUTURE) ×2 IMPLANT
TOWEL GREEN STERILE (TOWEL DISPOSABLE) ×2 IMPLANT
TOWEL GREEN STERILE FF (TOWEL DISPOSABLE) ×2 IMPLANT
TRAY LAPAROSCOPIC MC (CUSTOM PROCEDURE TRAY) ×2 IMPLANT
TROCAR XCEL 12X100 BLDLESS (ENDOMECHANICALS) ×2 IMPLANT
TROCAR XCEL NON-BLD 5MMX100MML (ENDOMECHANICALS) ×2 IMPLANT
WATER STERILE IRR 1000ML POUR (IV SOLUTION) ×2 IMPLANT

## 2019-09-10 NOTE — Anesthesia Postprocedure Evaluation (Signed)
Anesthesia Post Note  Patient: Jordan Pollard  Procedure(s) Performed: LAPAROSCOPIC CHOLECYSTECTOMY WITH INTRAOPERATIVE CHOLANGIOGRAM (N/A Abdomen)     Patient location during evaluation: PACU Anesthesia Type: General Level of consciousness: awake and alert Pain management: pain level controlled Vital Signs Assessment: post-procedure vital signs reviewed and stable Respiratory status: spontaneous breathing, nonlabored ventilation, respiratory function stable and patient connected to nasal cannula oxygen Cardiovascular status: blood pressure returned to baseline and stable Postop Assessment: no apparent nausea or vomiting Anesthetic complications: no    Last Vitals:  Vitals:   09/10/19 1523 09/10/19 1741  BP: 127/67 126/71  Pulse: 64 78  Resp: 16 17  Temp: (!) 36.4 C 36.9 C  SpO2: 96% 96%    Last Pain:  Vitals:   09/10/19 1959  TempSrc:   PainSc: 0-No pain                 Hayward Rylander P Imogean Ciampa

## 2019-09-10 NOTE — Transfer of Care (Signed)
Immediate Anesthesia Transfer of Care Note  Patient: Jordan Pollard  Procedure(s) Performed: LAPAROSCOPIC CHOLECYSTECTOMY WITH INTRAOPERATIVE CHOLANGIOGRAM (N/A Abdomen)  Patient Location: PACU  Anesthesia Type:General  Level of Consciousness: drowsy  Airway & Oxygen Therapy: Patient Spontanous Breathing and Patient connected to face mask oxygen  Post-op Assessment: Report given to RN and Post -op Vital signs reviewed and stable  Post vital signs: Reviewed  Last Vitals:  Vitals Value Taken Time  BP 123/71 09/10/19 1426  Temp 36.6 C 09/10/19 1426  Pulse 62 09/10/19 1429  Resp 25 09/10/19 1429  SpO2 100 % 09/10/19 1429  Vitals shown include unvalidated device data.  Last Pain:  Vitals:   09/10/19 1426  TempSrc:   PainSc: 0-No pain         Complications: No apparent anesthesia complications

## 2019-09-10 NOTE — Progress Notes (Signed)
PROGRESS NOTE  Jordan Pollard [id number] DOB: 07/26/1970 DOA: 09/08/2019 PCP: [name], MD  HPI/Recap of past 24 hours: HPI from Dr [name] 49 year old female with past medical history of papillary thyroid cancer status post thyroid resection and postsurgical hypothyroidism who presents to [facility] emergency department with complaints of severe worsening intermittent abdominal pain for the past 3 months located in the epigastric region, and radiating to the back. In the ED, CT abdomen revealed signs consistent with acute pancreatitis without evidence of hemorrhage or necrosis.  Lipase was found to be 2881.  Patient was also found to have markedly elevated AST of 421 and elevated ALT of 745, right upper quadrant ultrasound which revealed cholelithiasis with sonographic Percell Miller sign concerning for possible acute cholecystitis. Case was discussed with Dr. [name], GI who recommended obtaining an MRCP. Hospitalist group has now been called to assess patient for admission to the hospital.     Today, saw pt after surgery, reports feeling sleepy, controlled post op pain, denies any other new complaints.       Assessment/Plan: Principal Problem:   Acute gallstone pancreatitis Active Problems:   Acute cholecystitis   Hypothyroidism, postsurgical   Biliary pancreatitis s/p laparoscopic cholecystectomy with intraoperative cholangiogram 09/10/19 Noted transaminitis, elevated lipase, will trend Right upper quadrant ultrasound concerning for possible acute cholecystitis MRCP showed pancreatitis, no no CBD stone GI on board, appreciate recs General surgery on board s/p cholecystectomy and IOC with no evidence of choledocholithiasis on 09/10/2019 IVF, advance diet, pain management  Hypothyroidism Continue Synthroid        Malnutrition Type:      Malnutrition Characteristics:      Nutrition Interventions:       Estimated body mass index is 28.13 kg/m  as calculated from the following:   Height as of this encounter: 5\' 8"  (1.727 m).   Weight as of this encounter: 83.9 kg.     Code Status: Full  Family Communication: Discussed with patient  Disposition Plan: Status is: Inpatient  Remains inpatient appropriate because:Inpatient level of care appropriate due to severity of illness   Dispo: The patient is from: Home              Anticipated d/c is to: Home              Anticipated d/c date is: 1 day              Patient currently is not medically stable to d/c. Due to s/p cholecystectomy, pain management, gen surgery sign off    Consultants:  General surgery  GI  Procedures:  Cholecystectomy  Antimicrobials:  None  DVT prophylaxis: SCD   Objective: Vitals:   09/09/19 1831 09/09/19 2357 09/10/19 0620 09/10/19 0830  BP: (!) 110/57 112/61 (!) 121/53   Pulse: 80 73 77   Resp: 18 18 16    Temp: 98.4 F (36.9 C) 98.7 F (37.1 C) 98.8 F (37.1 C)   TempSrc: Oral Oral Oral   SpO2: 99% 96% 95%   Weight:    83.9 kg  Height:    5\' 8"  (1.727 m)    Intake/Output Summary (Last 24 hours) at 09/10/2019 1411 Last data filed at 09/10/2019 1410 Gross per 24 hour  Intake 3388.46 ml  Output --  Net 3388.46 ml   Filed Weights   09/10/19 0830  Weight: 83.9 kg    Exam:  General: NAD   Cardiovascular: S1, S2 present  Respiratory: CTAB  Abdomen: Soft, tender around lap  incision sites, nondistended, hypoactive bowel sounds  Musculoskeletal: No bilateral pedal edema noted  Skin: Normal  Psychiatry: Normal mood    Data Reviewed: CBC: Recent Labs  Lab 09/08/19 1937 09/09/19 0638 09/10/19 0317  WBC 14.8* 11.2* 10.4  NEUTROABS  --  9.5* 7.7  HGB 17.7* 14.9 13.0  HCT 54.4* 45.9 39.6  MCV 94.9 93.9 93.8  PLT 261 229 [id number]   Basic Metabolic Panel: Recent Labs  Lab 09/08/19 1937 09/09/19 0638 09/10/19 0317  NA 139 141 141  K 4.5 3.9 3.8  CL 104 107 107  CO2 21* 21* 23  GLUCOSE 169* 122* 106*  BUN 11  10 <5*  CREATININE 0.90 0.78 0.73  CALCIUM 10.0 8.6* 8.3*  MG  --   --  1.7   GFR: Estimated Creatinine Clearance: 96.6 mL/min (by C-G formula based on SCr of 0.73 mg/dL). Liver Function Tests: Recent Labs  Lab 09/08/19 1937 09/09/19 0638 09/10/19 0317  AST 421* 190* 63*  ALT 745* 483* 252*  ALKPHOS 237* 174* 122  BILITOT 1.3* 1.1 0.9  PROT 7.8 6.0* 5.0*  ALBUMIN 4.5 3.4* 2.8*   Recent Labs  Lab 09/08/19 1937 09/10/19 0317  LIPASE 2,881* 73*   No results for input(s): AMMONIA in the last 168 hours. Coagulation Profile: No results for input(s): INR, PROTIME in the last 168 hours. Cardiac Enzymes: No results for input(s): CKTOTAL, CKMB, CKMBINDEX, TROPONINI in the last 168 hours. BNP (last 3 results) No results for input(s): PROBNP in the last 8760 hours. HbA1C: No results for input(s): HGBA1C in the last 72 hours. CBG: No results for input(s): GLUCAP in the last 168 hours. Lipid Profile: Recent Labs    09/09/19 0638  CHOL 193  HDL 81  LDLCALC 105*  TRIG 34  CHOLHDL 2.4   Thyroid Function Tests: Recent Labs    09/09/19 0638  TSH 0.805   Anemia Panel: No results for input(s): VITAMINB12, FOLATE, FERRITIN, TIBC, IRON, RETICCTPCT in the last 72 hours. Urine analysis:    Component Value Date/Time   COLORURINE YELLOW 09/08/2019 1927   APPEARANCEUR CLEAR 09/08/2019 1927   LABSPEC 1.016 09/08/2019 1927   PHURINE 5.0 09/08/2019 1927   GLUCOSEU NEGATIVE 09/08/2019 [place] NEGATIVE 09/08/2019 [place] NEGATIVE 09/08/2019 1927   KETONESUR 20 (A) 09/08/2019 1927   PROTEINUR NEGATIVE 09/08/2019 1927   NITRITE NEGATIVE 09/08/2019 1927   LEUKOCYTESUR TRACE (A) 09/08/2019 1927   Sepsis Labs: @LABRCNTIP (procalcitonin:4,lacticidven:4)  ) Recent Results (from the past 240 hour(s))  SARS Coronavirus 2 by RT PCR (hospital order, performed in [place] hospital lab) Nasopharyngeal Nasopharyngeal Swab     Status: None   Collection Time: 09/08/19  11:25 PM   Specimen: Nasopharyngeal Swab  Result Value Ref Range Status   SARS Coronavirus 2 NEGATIVE NEGATIVE Final    Comment: (NOTE) SARS-CoV-2 target nucleic acids are NOT DETECTED. The SARS-CoV-2 RNA is generally detectable in upper and lower respiratory specimens during the acute phase of infection. The lowest concentration of SARS-CoV-2 viral copies this assay can detect is 250 copies / mL. A negative result does not preclude SARS-CoV-2 infection and should not be used as the sole basis for treatment or other patient management decisions.  A negative result may occur with improper specimen collection / handling, submission of specimen other than nasopharyngeal swab, presence of viral mutation(s) within the areas targeted by this assay, and inadequate number of viral copies (<250 copies / mL). A negative result must be combined with clinical  observations, patient history, and epidemiological information. Fact Sheet for Patients:   StrictlyIdeas.no Fact Sheet for Healthcare Providers: BankingDealers.co.za This test is not yet approved or cleared  by the [place] FDA and has been authorized for detection and/or diagnosis of SARS-CoV-2 by FDA under an Emergency Use Authorization (EUA).  This EUA will remain in effect (meaning this test can be used) for the duration of the COVID-19 declaration under Section 564(b)(1) of the Act, 21 U.S.C. section 360bbb-3(b)(1), unless the authorization is terminated or revoked sooner. Performed at [facility], [place] [address], [place], [place] [address]   Culture, blood (routine x 2)     Status: None (Preliminary result)   Collection Time: 09/09/19  5:45 AM   Specimen: BLOOD LEFT HAND  Result Value Ref Range Status   Specimen Description BLOOD LEFT HAND  Final   Special Requests AEROBIC BOTTLE ONLY Blood Culture adequate volume  Final   Culture   Final    NO GROWTH 1 DAY Performed at  [facility], [place] [address] [place], [place] [address]    Report Status PENDING  Incomplete  Culture, blood (routine x 2)     Status: None (Preliminary result)   Collection Time: 09/09/19  6:52 AM   Specimen: BLOOD RIGHT HAND  Result Value Ref Range Status   Specimen Description BLOOD RIGHT HAND  Final   Special Requests   Final    BOTTLES DRAWN AEROBIC AND ANAEROBIC Blood Culture adequate volume   Culture   Final    NO GROWTH 1 DAY Performed at [facility], [place] [address], [place], [place] [address]    Report Status PENDING  Incomplete  Surgical pcr screen     Status: Abnormal   Collection Time: 09/10/19  7:52 AM   Specimen: Nasal Mucosa; Nasal Swab  Result Value Ref Range Status   MRSA, PCR NEGATIVE NEGATIVE Final   Staphylococcus aureus POSITIVE (A) NEGATIVE Final    Comment: (NOTE) The Xpert SA Assay (FDA approved for NASAL specimens in patients 65 years of age and older), is one component of a comprehensive surveillance program. It is not intended to diagnose infection nor to guide or monitor treatment. Performed at [facility], [place] [address], [place], [place] [address]       Studies: No results found.  Scheduled Meds: . [MAR Hold] enoxaparin (LOVENOX) injection  40 mg Subcutaneous Daily  . [MAR Hold] levothyroxine  62.5 mcg Intravenous Daily  . [MAR Hold] sodium chloride flush  3 mL Intravenous Once    Continuous Infusions: . lactated ringers 150 mL/hr at 09/10/19 0538  . [MAR Hold] piperacillin-tazobactam (ZOSYN)  IV 3.375 g (09/10/19 0537)     LOS: 1 day     Alma Friendly, MD [facility]  If 7PM-7AM, please contact night-coverage [email or web address] 09/10/2019, 2:11 PM

## 2019-09-10 NOTE — Anesthesia Procedure Notes (Signed)
Procedure Name: Intubation Date/Time: 09/10/2019 12:59 PM Performed by: Janene Harvey, CRNA Pre-anesthesia Checklist: Patient identified, Emergency Drugs available, Suction available and Patient being monitored Patient Re-evaluated:Patient Re-evaluated prior to induction Oxygen Delivery Method: Circle system utilized Preoxygenation: Pre-oxygenation with 100% oxygen Induction Type: IV induction, Rapid sequence and Cricoid Pressure applied Laryngoscope Size: Mac and 4 Grade View: Grade I Tube type: Oral Tube size: 7.0 mm Number of attempts: 1 Airway Equipment and Method: Stylet and Oral airway Placement Confirmation: ETT inserted through vocal cords under direct vision,  positive ETCO2 and breath sounds checked- equal and bilateral Secured at: 22 cm Tube secured with: Tape Dental Injury: Teeth and Oropharynx as per pre-operative assessment

## 2019-09-10 NOTE — Plan of Care (Signed)
?  Problem: Education: ?Goal: Knowledge of General Education information will improve ?Description: Including pain rating scale, medication(s)/side effects and non-pharmacologic comfort measures ?Outcome: Progressing ?  ?Problem: Activity: ?Goal: Risk for activity intolerance will decrease ?Outcome: Progressing ?  ?Problem: Nutrition: ?Goal: Adequate nutrition will be maintained ?Outcome: Progressing ?  ?Problem: Coping: ?Goal: Level of anxiety will decrease ?Outcome: Progressing ?  ?Problem: Elimination: ?Goal: Will not experience complications related to urinary retention ?Outcome: Progressing ?  ?Problem: Pain Managment: ?Goal: General experience of comfort will improve ?Outcome: Progressing ?  ?Problem: Safety: ?Goal: Ability to remain free from injury will improve ?Outcome: Progressing ?  ?

## 2019-09-10 NOTE — Progress Notes (Signed)
Daily Rounding Note  09/10/2019, 8:20 AM  LOS: 1 day   SUBJECTIVE:   Chief complaint:  pancreatitis   Pt off floor in pre-op  OBJECTIVE:         Vital signs in last 24 hours:    Temp:  [97.9 F (36.6 C)-98.8 F (37.1 C)] 98.8 F (37.1 C) (05/13 0620) Pulse Rate:  [72-80] 77 (05/13 0620) Resp:  [16-18] 16 (05/13 0620) BP: (101-121)/(52-61) 121/53 (05/13 0620) SpO2:  [95 %-99 %] 95 % (05/13 0620)   There were no vitals filed for this visit. Pt not seen.  In preop for lap chole.    Intake/Output from previous day: 05/12 0701 - 05/13 0700 In: 2209.7 [P.O.:240; I.V.:1802; IV Piggyback:167.7] Out: -   Intake/Output this shift: No intake/output data recorded.  Lab Results: Recent Labs    09/08/19 1937 09/09/19 0638 09/10/19 0317  WBC 14.8* 11.2* 10.4  HGB 17.7* 14.9 13.0  HCT 54.4* 45.9 39.6  PLT 261 229 213   BMET Recent Labs    09/08/19 1937 09/09/19 0638 09/10/19 0317  NA 139 141 141  K 4.5 3.9 3.8  CL 104 107 107  CO2 21* 21* 23  GLUCOSE 169* 122* 106*  BUN 11 10 <5*  CREATININE 0.90 0.78 0.73  CALCIUM 10.0 8.6* 8.3*   LFT Recent Labs    09/08/19 1937 09/09/19 0638 09/10/19 0317  PROT 7.8 6.0* 5.0*  ALBUMIN 4.5 3.4* 2.8*  AST 421* 190* 63*  ALT 745* 483* 252*  ALKPHOS 237* 174* 122  BILITOT 1.3* 1.1 0.9   PT/INR No results for input(s): LABPROT, INR in the last 72 hours. Hepatitis Panel Recent Labs    09/08/19 2322  HEPBSAG NON REACTIVE  HCVAB NON REACTIVE  HEPAIGM NON REACTIVE  HEPBIGM NON REACTIVE    Studies/Results: CT ABDOMEN PELVIS W CONTRAST  Result Date: 09/08/2019 CLINICAL DATA:  Epigastric pain and nausea EXAM: CT ABDOMEN AND PELVIS WITH CONTRAST TECHNIQUE: Multidetector CT imaging of the abdomen and pelvis was performed using the standard protocol following bolus administration of intravenous contrast. CONTRAST:  147mL OMNIPAQUE IOHEXOL 300 MG/ML  SOLN COMPARISON:   None. FINDINGS: LOWER CHEST: Normal. HEPATOBILIARY: Normal hepatic contours. No intra- or extrahepatic biliary dilatation. The gallbladder is normal. PANCREAS: Large amount of inflammatory stranding adjacent to the body and tail the pancreas. No peripancreatic fluid collection. No parenchymal pancreatic abnormality. Pancreatic duct is normal. SPLEEN: Normal. ADRENALS/URINARY TRACT: The adrenal glands are normal. No hydronephrosis, nephroureterolithiasis or solid renal mass. The urinary bladder is normal for degree of distention STOMACH/BOWEL: There is no hiatal hernia. Normal duodenal course and caliber. No small bowel dilatation or inflammation. No focal colonic abnormality. Normal appendix. VASCULAR/LYMPHATIC: Normal course and caliber of the major abdominal vessels. No abdominal or pelvic lymphadenopathy. REPRODUCTIVE: Status post hysterectomy. No adnexal mass. MUSCULOSKELETAL. No bony spinal canal stenosis or focal osseous abnormality. OTHER: None. IMPRESSION: Acute pancreatitis without peripancreatic fluid collection or evidence of pancreatic necrosis. Electronically Signed   By: Ulyses Jarred M.D.   On: 09/08/2019 23:17   MR ABDOMEN MRCP W WO CONTAST  Result Date: 09/09/2019 CLINICAL DATA:  Epigastric pain and nausea for 2 days. EXAM: MRI ABDOMEN WITHOUT AND WITH CONTRAST (INCLUDING MRCP) TECHNIQUE: Multiplanar multisequence MR imaging of the abdomen was performed both before and after the administration of intravenous contrast. Heavily T2-weighted images of the biliary and pancreatic ducts were obtained, and three-dimensional MRCP images were rendered by post processing. CONTRAST:  7.73mL GADAVIST GADOBUTROL 1 MMOL/ML IV SOLN COMPARISON:  Ultrasound and CT 09/08/1998 FINDINGS: Lower chest:  Lung bases are clear. Hepatobiliary: Several large gallstones and multiple small gallstones collect in the neck of the gallbladder. Gallbladder measures 3.7 cm diameter. No gallbladder wall thickening. No comparison  cholecystic fluid. No clear gallbladder inflammation. The common hepatic duct and common bile duct are prominent and mildly dilated with the common bile duct measuring 8 mm in diameter. No filling defects within the common bile duct. Common bile duct is normal caliber through the pancreatic head measuring 4 mm and tapers smoothly to the ampulla (image 23/series 14/MRCP sequence) Postcontrast enhanced imaging demonstrates no hepatic lesion. Pancreas: Mild edema in the body and tail of the pancreas. Pancreatic duct is normal caliber. There is no variant pancreatic ductal anatomy. Postcontrast imaging demonstrates uniform enhancement of the pancreas. On T2 weighted imaging there is peripancreatic fluid (image 6/series 16). This fluid extends along the body and tail primarily. Small amount of fluid also collects in the anterior to the LEFT pararenal space. Edema extends into the central mesentery also (image 29/series 4). Spleen: Normal spleen. Adrenals/urinary tract: Adrenal glands and kidneys are normal. Stomach/Bowel: Stomach and limited of the small bowel is unremarkable Vascular/Lymphatic: No upper abdominal adenopathy.  Abdominal aorta Musculoskeletal: No aggressive osseous lesion IMPRESSION: 1. Findings consistent acute pancreatitis with edema surrounding the body and tail the pancreas and extending into the mesentery along the LEFT pararenal space. No organized fluid collections. No pancreatic necrosis. 2. No variant pancreatic ductal anatomy. 3. Multiple large and small gallstones collect towards the neck of the gallbladder however no clear evidence of acute cholecystitis. No choledocholithiasis. 4. Potential gallstone pancreatitis with passage a gallstone through the common bile duct. Other etiologies of pancreatitis should also be considered. Electronically Signed   By: Suzy Bouchard M.D.   On: 09/09/2019 07:48   US Abdomen Limited RUQ  Result Date: 09/08/2019 CLINICAL DATA:  Right upper quadrant pain  EXAM: ULTRASOUND ABDOMEN LIMITED RIGHT UPPER QUADRANT COMPARISON:  None. FINDINGS: Gallbladder: Multiple gallstones. Positive sonographic Percell Miller sign was reported by the sonographer. No gallbladder wall thickening or pericholecystic fluid. Common bile duct: Diameter: 4 mm Liver: No focal lesion identified. Within normal limits in parenchymal echogenicity. Portal vein is patent on color Doppler imaging with normal direction of blood flow towards the liver. Other: None. IMPRESSION: Cholelithiasis with a positive sonographic Murphy sign. This is compatible with acute cholecystitis in the appropriate context. However, the findings on the concomitant CT of the abdomen and pelvis are more consistent with acute pancreatitis. Electronically Signed   By: Ulyses Jarred M.D.   On: 09/08/2019 23:22    ASSESMENT:   *  Acute biliary pancreatitis.  Suspect transient choledocholithiasis.  No CBD stones or ductal dilatation on CT or MRCP.  Elevated LFTs and Lipase rapidly improving.    *   Cholelithiasis.     PLAN   *   Lap chole (w IOC?) this AM.      Azucena Freed  09/10/2019, 8:20 AM Phone [phone]

## 2019-09-10 NOTE — Op Note (Signed)
PATIENT:  [name]  49 y.o. female  PRE-OPERATIVE DIAGNOSIS:  Gallstone pancreatitis  POST-OPERATIVE DIAGNOSIS:  Gallstone pancreatitis  PROCEDURE:  Procedure(s): LAPAROSCOPIC CHOLECYSTECTOMY WITH INTRAOPERATIVE CHOLANGIOGRAM   SURGEON:  Surgeon(s): Doniesha Landau, Arta Bruce, MD  ASSISTANT: Alferd Apa, M.D.  ANESTHESIA:   local and general  Indications for procedure: COLBEE KOVALESKI is a 49 y.o. female with symptoms of Abdominal pain and Nausea and vomiting consistent with gallbladder disease, Confirmed by Ultrasound.  Description of procedure: The patient was brought into the operative suite, placed supine. Anesthesia was administered with endotracheal tube. Patient was strapped in place and foot board was secured. All pressure points were offloaded by foam padding. The patient was prepped and draped in the usual sterile fashion.  A small incision was made to the right of the umbilicus. A 78mm trocar was inserted into the peritoneal cavity with optical entry. Pneumoperitoneum was applied with high flow low pressure. 2 27mm trocars were placed in the RUQ. A 13mm trocar was placed in the subxiphoid space. Marcaine was infused to the subxiphoid space and lateral upper right abdomen in the transversus abdominis plane. Next the patient was placed in reverse trendelenberg. The gallbladder was inflamed and distended. There were a few adhesions to the omentum removed with cautery.  The gallbladder was retracted cephalad and lateral. The peritoneum was reflected off the infundibulum working lateral to medial. There was a large tortuous hepatic artery near the infundibulum. Great care was taken to stay well lateral of the area. The cystic duct and cystic artery were identified and further dissection revealed a critical view, due to concern for choledocholithiasis a cholangiogram was performed with ductotomy and cook catheter passed through a separate subcostal stab incision. Normal ductal  anatomy was seen. The cystic duct and cystic artery were doubly clipped and ligated.   The gallbladder was removed off the liver bed with cautery. The Gallbladder was placed in a specimen bag. The gallbladder fossa was irrigated and hemostasis was applied with cautery. The gallbladder was removed via the 40mm trocar. The fascial defect was closed with interrupted 0 vicryl suture via laparoscopic trans-fascial suture passer. Pneumoperitoneum was removed, all trocar were removed. All incisions were closed with 4-0 monocryl subcuticular stitch. The patient woke from anesthesia and was brought to PACU in stable condition. All counts were correct  Findings: inflamed gallbladder  Specimen: gallbladder  Blood loss: 30 ml  Local anesthesia: 30 ml marcaine  Complications: none  PLAN OF CARE: Admit to inpatient   PATIENT DISPOSITION:  PACU - hemodynamically stable.  Images:      Gurney Maxin, M.D. General, Bariatric, & Minimally Invasive Surgery [facility]

## 2019-09-10 NOTE — Progress Notes (Signed)
Pre Procedure note for inpatients:   Jordan Pollard has been scheduled for Procedure(s): LAPAROSCOPIC CHOLECYSTECTOMY WITH INTRAOPERATIVE CHOLANGIOGRAM (N/A) today. The various methods of treatment have been discussed with the patient. After consideration of the risks, benefits and treatment options the patient has consented to the planned procedure.   The patient has been seen and labs reviewed. There are no changes in the patient's condition to prevent proceeding with the planned procedure today.  Recent labs:  Lab Results  Component Value Date   WBC 10.4 09/10/2019   HGB 13.0 09/10/2019   HCT 39.6 09/10/2019   PLT 213 09/10/2019   GLUCOSE 106 (H) 09/10/2019   CHOL 193 09/09/2019   TRIG 34 09/09/2019   HDL 81 09/09/2019   LDLCALC 105 (H) 09/09/2019   ALT 252 (H) 09/10/2019   AST 63 (H) 09/10/2019   NA 141 09/10/2019   K 3.8 09/10/2019   CL 107 09/10/2019   CREATININE 0.73 09/10/2019   BUN <5 (L) 09/10/2019   CO2 23 09/10/2019   TSH 0.805 09/09/2019    Mickeal Skinner, MD 09/10/2019 12:45 PM

## 2019-09-11 LAB — CBC WITH DIFFERENTIAL/PLATELET
Abs Immature Granulocytes: 0.03 10*3/uL (ref 0.00–0.07)
Basophils Absolute: 0 10*3/uL (ref 0.0–0.1)
Basophils Relative: 0 %
Eosinophils Absolute: 0 10*3/uL (ref 0.0–0.5)
Eosinophils Relative: 0 %
HCT: 37.4 % (ref 36.0–46.0)
Hemoglobin: 12.2 g/dL (ref 12.0–15.0)
Immature Granulocytes: 0 %
Lymphocytes Relative: 15 %
Lymphs Abs: 1.5 10*3/uL (ref 0.7–4.0)
MCH: 30.4 pg (ref 26.0–34.0)
MCHC: 32.6 g/dL (ref 30.0–36.0)
MCV: 93.3 fL (ref 80.0–100.0)
Monocytes Absolute: 0.5 10*3/uL (ref 0.1–1.0)
Monocytes Relative: 5 %
Neutro Abs: 7.8 10*3/uL — ABNORMAL HIGH (ref 1.7–7.7)
Neutrophils Relative %: 80 %
Platelets: 200 10*3/uL (ref 150–400)
RBC: 4.01 MIL/uL (ref 3.87–5.11)
RDW: 14 % (ref 11.5–15.5)
WBC: 9.8 10*3/uL (ref 4.0–10.5)
nRBC: 0 % (ref 0.0–0.2)

## 2019-09-11 LAB — COMPREHENSIVE METABOLIC PANEL
ALT: 205 U/L — ABNORMAL HIGH (ref 0–44)
AST: 71 U/L — ABNORMAL HIGH (ref 15–41)
Albumin: 2.6 g/dL — ABNORMAL LOW (ref 3.5–5.0)
Alkaline Phosphatase: 106 U/L (ref 38–126)
Anion gap: 10 (ref 5–15)
BUN: 9 mg/dL (ref 6–20)
CO2: 23 mmol/L (ref 22–32)
Calcium: 8.5 mg/dL — ABNORMAL LOW (ref 8.9–10.3)
Chloride: 107 mmol/L (ref 98–111)
Creatinine, Ser: 0.74 mg/dL (ref 0.44–1.00)
GFR calc Af Amer: >60 mL/min (ref >60)
GFR calc non Af Amer: >60 mL/min (ref >60)
Glucose, Bld: 104 mg/dL — ABNORMAL HIGH (ref 70–99)
Potassium: 3.6 mmol/L (ref 3.5–5.1)
Sodium: 140 mmol/L (ref 135–145)
Total Bilirubin: 1.1 mg/dL (ref 0.3–1.2)
Total Protein: 5.2 g/dL — ABNORMAL LOW (ref 6.5–8.1)

## 2019-09-11 LAB — SURGICAL PATHOLOGY

## 2019-09-11 MED ORDER — IBUPROFEN 800 MG PO TABS
800.0000 mg | ORAL_TABLET | Freq: Three times a day (TID) | ORAL | 0 refills | Status: DC | PRN
Start: 2019-09-11 — End: 2021-06-18

## 2019-09-11 MED ORDER — OXYCODONE HCL 5 MG PO TABS: 5.0000 mg | ORAL_TABLET | Freq: Four times a day (QID) | ORAL | 0 refills | Status: DC | PRN

## 2019-09-11 NOTE — Progress Notes (Signed)
Nsg Discharge Note  Patient discharged home. Manual Meier providing transportation. Reviewed instructions from [facility]. Questions answered.  Admit Date:  09/08/2019 Discharge date: 09/11/2019   [name] to be D/C'd Home per MD order.  AVS completed.  Copy for chart, and copy for patient signed, and dated. Patient/caregiver able to verbalize understanding.  Discharge Medication: Allergies as of 09/11/2019   No Known Allergies     Medication List    TAKE these medications   acetaminophen 500 MG tablet Commonly known as: TYLENOL Take 1,000 mg by mouth daily as needed for mild pain or moderate pain.   ALPRAZolam 0.5 MG tablet Commonly known as: XANAX Take 0.5 mg by mouth 2 (two) times daily as needed for anxiety.   Calcium-Carb 600 600 MG Tabs tablet Generic drug: calcium carbonate Take 1 tablet by mouth daily.   clotrimazole-betamethasone lotion Commonly known as: LOTRISONE Apply 1 application topically 2 (two) times daily as needed for rash.   fluticasone 50 MCG/ACT nasal spray Commonly known as: FLONASE Place 1 spray into the nose at bedtime as needed for allergies.   glucosamine-chondroitin 500-400 MG tablet Take 2 tablets by mouth.   ibuprofen 200 MG tablet Commonly known as: ADVIL Take 400-600 mg by mouth every 8 (eight) hours as needed for fever or moderate pain. What changed: Another medication with the same name was added. Make sure you understand how and when to take each.   ibuprofen 800 MG tablet Commonly known as: ADVIL Take 1 tablet (800 mg total) by mouth every 8 (eight) hours as needed. What changed: You were already taking a medication with the same name, and this prescription was added. Make sure you understand how and when to take each.   levothyroxine 125 MCG tablet Commonly known as: SYNTHROID Take 0.5-1 tablets by mouth See admin instructions. Take 1 tablet a day except for Sunday, take 1/2 tablet.   omega-3 fish oil  1000 MG Caps capsule Commonly known as: MAXEPA Take 2 capsules by mouth daily.   oxyCODONE 5 MG immediate release tablet Commonly known as: Oxy IR/ROXICODONE Take 1 tablet (5 mg total) by mouth every 6 (six) hours as needed for breakthrough pain.   psyllium 0.52 g capsule Commonly known as: REGULOID Take 2 capsules by mouth daily.   Vitamin D3 125 MCG (5000 UT) Caps Take 1 capsule by mouth daily.       Discharge Assessment: Vitals:   09/11/19 0111 09/11/19 0645  BP: (!) 108/54 (!) 108/59  Pulse: 74 68  Resp: 18 18  Temp: 98.4 F (36.9 C) 98.6 F (37 C)  SpO2: 94% 95%   Skin clean, dry and intact without evidence of skin break down, no evidence of skin tears noted. IV catheter discontinued intact. Site without signs and symptoms of complications - no redness or edema noted at insertion site, patient denies c/o pain - only slight tenderness at site.  Dressing with slight pressure applied.  D/c Instructions-Education: Discharge instructions given to patient/family with verbalized understanding. D/c education completed with patient/family including follow up instructions, medication list, d/c activities limitations if indicated, with other d/c instructions as indicated by MD - patient able to verbalize understanding, all questions fully answered. Patient instructed to return to ED, call 911, or call MD for any changes in condition.  Patient escorted via [place], and D/C home via private auto.  Erasmo Leventhal, RN 09/11/2019 11:30 AM

## 2019-09-11 NOTE — Discharge Instructions (Signed)
[facility] ° °Please arrive at least 30 min before your appointment to complete your check in paperwork.  If you are unable to arrive 30 min prior to your appointment time we may have to cancel or reschedule you. °LAPAROSCOPIC SURGERY: POST OP INSTRUCTIONS °Always review your discharge instruction sheet given to you by the facility where your surgery was performed. °IF YOU HAVE DISABILITY OR FAMILY LEAVE FORMS, YOU MUST BRING THEM TO THE OFFICE FOR PROCESSING.   °DO NOT GIVE THEM TO YOUR DOCTOR. ° °PAIN CONTROL ° °1. First take acetaminophen (Tylenol) AND/or ibuprofen (Advil) to control your pain after surgery.  Follow directions on package.  Taking acetaminophen (Tylenol) and/or ibuprofen (Advil) regularly after surgery will help to control your pain and lower the amount of prescription pain medication you may need.  You should not take more than 4,000 mg (4 grams) of acetaminophen (Tylenol) in 24 hours.  You should not take ibuprofen (Advil), aleve, motrin, naprosyn or other NSAIDS if you have a history of stomach ulcers or chronic kidney disease.  °2. A prescription for pain medication may be given to you upon discharge.  Take your pain medication as prescribed, if you still have uncontrolled pain after taking acetaminophen (Tylenol) or ibuprofen (Advil). °3. Use ice packs to help control pain. °4. If you need a refill on your pain medication, please contact your pharmacy.  They will contact our office to request authorization. Prescriptions will not be filled after 5pm or on week-ends. ° °HOME MEDICATIONS °5. Take your usually prescribed medications unless otherwise directed. ° °DIET °6. You should follow a light diet the first few days after arrival home.  Be sure to include lots of fluids daily. Avoid fatty, fried foods.  ° °CONSTIPATION °7. It is common to experience some constipation after surgery and if you are taking pain medication.  Increasing fluid intake and taking a stool  softener (such as Colace) will usually help or prevent this problem from occurring.  A mild laxative (Milk of Magnesia or Miralax) should be taken according to package instructions if there are no bowel movements after 48 hours. ° °WOUND/INCISION CARE °8. Most patients will experience some swelling and bruising in the area of the incisions.  Ice packs will help.  Swelling and bruising can take several days to resolve.  °9. Unless discharge instructions indicate otherwise, follow guidelines below  °a. STERI-STRIPS - you may remove your outer bandages 48 hours after surgery, and you may shower at that time.  You have steri-strips (small skin tapes) in place directly over the incision.  These strips should be left on the skin for 7-10 days.   °b. DERMABOND/SKIN GLUE - you may shower in 24 hours.  The glue will flake off over the next 2-3 weeks. °10. Any sutures or staples will be removed at the office during your follow-up visit. ° °ACTIVITIES °11. You may resume regular (light) daily activities beginning the next day--such as daily self-care, walking, climbing stairs--gradually increasing activities as tolerated.  You may have sexual intercourse when it is comfortable.  Refrain from any heavy lifting or straining until approved by your doctor. °a. You may drive when you are no longer taking prescription pain medication, you can comfortably wear a seatbelt, and you can safely maneuver your car and apply brakes. ° °FOLLOW-UP °12. You should see your doctor in the office for a follow-up appointment approximately 2-3 weeks after your surgery.  You should have been given your post-op/follow-up appointment when   your surgery was scheduled.  If you did not receive a post-op/follow-up appointment, make sure that you call for this appointment within a day or two after you arrive home to insure a convenient appointment time. ° ° °WHEN TO CALL YOUR DOCTOR: °1. Fever over 101.0 °2. Inability to urinate °3. Continued bleeding from  incision. °4. Increased pain, redness, or drainage from the incision. °5. Increasing abdominal pain ° °The clinic staff is available to answer your questions during regular business hours.  Please don’t hesitate to call and ask to speak to one of the nurses for clinical concerns.  If you have a medical emergency, go to the nearest emergency room or call 911.  A surgeon from [facility] is always on call at the hospital. °[address], [address], [place], [place]  [address] ? [address] [address], [place], [place]   [address] °[phone] ? [phone] ? FAX [phone] ° °••••••••• ° ° °Managing Your Pain After Surgery Without Opioids ° ° ° °Thank you for participating in our program to help patients manage their pain after surgery without opioids. This is part of our effort to provide you with the best care possible, without exposing you or your family to the risk that opioids pose. ° °What pain can I expect after surgery? °You can expect to have some pain after surgery. This is normal. The pain is typically worse the day after surgery, and quickly begins to get better. °Many studies have found that many patients are able to manage their pain after surgery with Over-the-Counter (OTC) medications such as Tylenol and Motrin. If you have a condition that does not allow you to take Tylenol or Motrin, notify your surgical team. ° °How will I manage my pain? °The best strategy for controlling your pain after surgery is around the clock pain control with Tylenol (acetaminophen) and Motrin (ibuprofen or Advil). Alternating these medications with each other allows you to maximize your pain control. In addition to Tylenol and Motrin, you can use heating pads or ice packs on your incisions to help reduce your pain. ° °How will I alternate your regular strength over-the-counter pain medication? °You will take a dose of pain medication every three hours. °; Start by taking 650 mg of Tylenol (2 pills of 325  mg) °; 3 hours later take 600 mg of Motrin (3 pills of 200 mg) °; 3 hours after taking the Motrin take 650 mg of Tylenol °; 3 hours after that take 600 mg of Motrin. ° ° °- 1 - ° °See example - if your first dose of Tylenol is at 12:00 PM ° ° °12:00 PM Tylenol 650 mg (2 pills of 325 mg)  °3:00 PM Motrin 600 mg (3 pills of 200 mg)  °6:00 PM Tylenol 650 mg (2 pills of 325 mg)  °9:00 PM Motrin 600 mg (3 pills of 200 mg)  °Continue alternating every 3 hours  ° °We recommend that you follow this schedule around-the-clock for at least 3 days after surgery, or until you feel that it is no longer needed. Use the table on the last page of this handout to keep track of the medications you are taking. °Important: °Do not take more than 3000mg of Tylenol or 3200mg of Motrin in a 24-hour period. °Do not take ibuprofen/Motrin if you have a history of bleeding stomach ulcers, severe kidney disease, &/or actively taking a blood thinner ° °What if I still have pain? °If you have pain that is not   controlled with the over-the-counter pain medications (Tylenol and Motrin or Advil) you might have what we call “breakthrough” pain. You will receive a prescription for a small amount of an opioid pain medication such as Oxycodone, Tramadol, or Tylenol with Codeine. Use these opioid pills in the first 24 hours after surgery if you have breakthrough pain. Do not take more than 1 pill every 4-6 hours. ° °If you still have uncontrolled pain after using all opioid pills, don't hesitate to call our staff using the number provided. We will help make sure you are managing your pain in the best way possible, and if necessary, we can provide a prescription for additional pain medication. ° ° °Day 1   ° °Time  °Name of Medication Number of pills taken  °Amount of Acetaminophen  °Pain Level  ° °Comments  °AM PM       °AM PM       °AM PM       °AM PM       °AM PM       °AM PM       °AM PM       °AM PM       °Total Daily amount of Acetaminophen °Do not  take more than  3,000 mg per day    ° ° °Day 2   ° °Time  °Name of Medication Number of pills °taken  °Amount of Acetaminophen  °Pain Level  ° °Comments  °AM PM       °AM PM       °AM PM       °AM PM       °AM PM       °AM PM       °AM PM       °AM PM       °Total Daily amount of Acetaminophen °Do not take more than  3,000 mg per day    ° ° °Day 3   ° °Time  °Name of Medication Number of pills taken  °Amount of Acetaminophen  °Pain Level  ° °Comments  °AM PM       °AM PM       °AM PM       °AM PM       ° ° ° °AM PM       °AM PM       °AM PM       °AM PM       °Total Daily amount of Acetaminophen °Do not take more than  3,000 mg per day    ° ° °Day 4   ° °Time  °Name of Medication Number of pills taken  °Amount of Acetaminophen  °Pain Level  ° °Comments  °AM PM       °AM PM       °AM PM       °AM PM       °AM PM       °AM PM       °AM PM       °AM PM       °Total Daily amount of Acetaminophen °Do not take more than  3,000 mg per day    ° ° °Day 5   ° °Time  °Name of Medication Number °of pills taken  °Amount of Acetaminophen  °Pain Level  ° °Comments  °AM PM       °AM PM       °AM   PM       °AM PM       °AM PM       °AM PM       °AM PM       °AM PM       °Total Daily amount of Acetaminophen °Do not take more than  3,000 mg per day    ° ° ° °Day 6   ° °Time  °Name of Medication Number of pills °taken  °Amount of Acetaminophen  °Pain Level  °Comments  °AM PM       °AM PM       °AM PM       °AM PM       °AM PM       °AM PM       °AM PM       °AM PM       °Total Daily amount of Acetaminophen °Do not take more than  3,000 mg per day    ° ° °Day 7   ° °Time  °Name of Medication Number of pills taken  °Amount of Acetaminophen  °Pain Level  ° °Comments  °AM PM       °AM PM       °AM PM       °AM PM       °AM PM       °AM PM       °AM PM       °AM PM       °Total Daily amount of Acetaminophen °Do not take more than  3,000 mg per day    ° ° ° ° °For additional information about how and where to safely dispose of unused  opioid °medications - https://www.morepowerfulnc.org ° °Disclaimer: This document contains information and/or instructional materials adapted from [facility] for the typical patient with your condition. It does not replace medical advice from your health care provider because your experience may differ from that of the °typical patient. Talk to your health care provider if you have any questions about this °document, your condition or your treatment plan. °Adapted from [facility] ° ° °Gallbladder Eating Plan °If you have a gallbladder condition, you may have trouble digesting fats. Eating a low-fat diet can help reduce your symptoms, and may be helpful before and after having surgery to remove your gallbladder (cholecystectomy). Your health care provider may recommend that you work with a diet and nutrition specialist (dietitian) to help you reduce the amount of fat in your diet. °What are tips for following this plan? °General guidelines °· Limit your fat intake to less than 30% of your total daily calories. If you eat around 1,800 calories each day, this is less than 60 grams (g) of fat per day. °· Fat is an important part of a healthy diet. Eating a low-fat diet can make it hard to maintain a healthy body weight. Ask your dietitian how much fat, calories, and other nutrients you need each day. °· Eat small, frequent meals throughout the day instead of three large meals. °· Drink at least 8-10 cups of fluid a day. Drink enough fluid to keep your urine clear or pale yellow. °· Limit alcohol intake to no more than 1 drink a day for nonpregnant women and 2 drinks a day for men. One drink equals 12 oz of beer, 5 oz of wine, or 1½ oz of hard liquor. °Reading food labels °· Check Nutrition Facts on food labels   for the amount of fat per serving. Choose foods with less than 3 grams of fat per serving. °Shopping °· Choose nonfat and low-fat healthy foods. Look for the words “nonfat,” “low fat,” or “fat  free.” °· Avoid buying processed or prepackaged foods. °Cooking °· Cook using low-fat methods, such as baking, broiling, grilling, or boiling. °· Cook with small amounts of healthy fats, such as olive oil, grapeseed oil, canola oil, or sunflower oil. °What foods are recommended? ° °· All fresh, frozen, or canned fruits and vegetables. °· Whole grains. °· Low-fat or non-fat (skim) milk and yogurt. °· Lean meat, skinless poultry, fish, eggs, and beans. °· Low-fat protein supplement powders or drinks. °· Spices and herbs. °What foods are not recommended? °· High-fat foods. These include baked goods, fast food, fatty cuts of meat, ice cream, french toast, sweet rolls, pizza, cheese bread, foods covered with butter, creamy sauces, or cheese. °· Fried foods. These include french fries, tempura, battered fish, breaded chicken, fried breads, and sweets. °· Foods with strong odors. °· Foods that cause bloating and gas. °Summary °· A low-fat diet can be helpful if you have a gallbladder condition, or before and after gallbladder surgery. °· Limit your fat intake to less than 30% of your total daily calories. This is about 60 g of fat if you eat 1,800 calories each day. °· Eat small, frequent meals throughout the day instead of three large meals. °This information is not intended to replace advice given to you by your health care provider. Make sure you discuss any questions you have with your health care provider. °Document Revised: 08/07/2018 Document Reviewed: 05/24/2016 °Elsevier Patient Education © 2020 [employer] ° °

## 2019-09-11 NOTE — Progress Notes (Signed)
Progress Note: General Surgery Service   Chief Complaint/Subjective: Pain controlled, soreness with sitting up. No nausea  Objective: Vital signs in last 24 hours: Temp:  [97.5 F (36.4 C)-98.6 F (37 C)] 98.6 F (37 C) (05/14 0645) Pulse Rate:  [64-81] 68 (05/14 0645) Resp:  [16-25] 18 (05/14 0645) BP: (108-135)/(54-71) 108/59 (05/14 0645) SpO2:  [94 %-100 %] 95 % (05/14 0645) Last BM Date: 09/08/19  Intake/Output from previous day: 05/13 0701 - 05/14 0700 In: 3351.6 [P.O.:600; I.V.:2572.9; IV Piggyback:178.8] Out: 50 [Blood:50] Intake/Output this shift: No intake/output data recorded.  Gen: NAd  Resp: nonlabored  Card: RRR  Abd: soft, incisions c/d/i  Lab Results: CBC  Recent Labs    09/10/19 0317 09/11/19 0634  WBC 10.4 9.8  HGB 13.0 12.2  HCT 39.6 37.4  PLT 213 200   BMET Recent Labs    09/10/19 0317 09/11/19 0634  NA 141 140  K 3.8 3.6  CL 107 107  CO2 23 23  GLUCOSE 106* 104*  BUN <5* 9  CREATININE 0.73 0.74  CALCIUM 8.3* 8.5*   PT/INR No results for input(s): LABPROT, INR in the last 72 hours. ABG No results for input(s): PHART, HCO3 in the last 72 hours.  Invalid input(s): PCO2, PO2  Anti-infectives: Anti-infectives (From admission, onward)   Start     Dose/Rate Route Frequency Ordered Stop   09/09/19 0600  piperacillin-tazobactam (ZOSYN) IVPB 3.375 g  Status:  Discontinued     3.375 g 12.5 mL/hr over 240 Minutes Intravenous Every 8 hours 09/09/19 0132 09/10/19 1457   09/09/19 0530  piperacillin-tazobactam (ZOSYN) IVPB 3.375 g  Status:  Discontinued     3.375 g 100 mL/hr over 30 Minutes Intravenous Every 6 hours 09/09/19 0127 09/09/19 0131   09/08/19 2345  piperacillin-tazobactam (ZOSYN) IVPB 3.375 g     3.375 g 100 mL/hr over 30 Minutes Intravenous  Once 09/08/19 2331 09/09/19 0016      Medications: Scheduled Meds: . acetaminophen  1,000 mg Oral Q6H  . celecoxib  200 mg Oral BID  . enoxaparin (LOVENOX) injection  40 mg  Subcutaneous Q24H  . levothyroxine  62.5 mcg Intravenous Daily  . sodium chloride flush  3 mL Intravenous Once   Continuous Infusions: . lactated ringers 50 mL/hr at 09/10/19 1613   PRN Meds:.morphine injection, ondansetron (ZOFRAN) IV, oxyCODONE  Assessment/Plan: s/p Procedure(s): LAPAROSCOPIC CHOLECYSTECTOMY WITH INTRAOPERATIVE CHOLANGIOGRAM 09/10/2019 -advance diet -ok for discharge today -f/u in clinic in 3 weeks    LOS: 2 days   Mickeal Skinner, MD [facility]

## 2019-09-11 NOTE — Discharge Summary (Signed)
Discharge Summary  Jordan Pollard [id number] DOB: 1970-11-27  PCP: [name], MD  Admit date: 09/08/2019 Discharge date: 09/11/2019  Time spent: 40 mins  Recommendations for Outpatient Follow-up:  1. PCP in 1 week 2. General surgery is scheduled  Discharge Diagnoses:  Active Hospital Problems   Diagnosis Date Noted  . Acute gallstone pancreatitis 09/09/2019  . Acute cholecystitis 09/09/2019  . 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    Discharge Condition: Stable  Diet recommendation: Regular diet, as tolerated  Vitals:   09/11/19 0111 09/11/19 0645  BP: (!) 108/54 (!) 108/59  Pulse: 74 68  Resp: 18 18  Temp: 98.4 F (36.9 C) 98.6 F (37 C)  SpO2: 94% 95%    History of present illness:  49 year old female with past medical history of papillary thyroid cancer status post thyroid resection and postsurgical hypothyroidism who presents to [facility] emergency department with complaints of severe worsening intermittent abdominal pain for the past 3 months located in the epigastric region, and radiating to the back. In the ED, CT abdomen revealed signs consistent with acute pancreatitis without evidence of hemorrhage or necrosis. Lipase was found to be 2881. Patient was also found to have markedly elevated AST of 421 and elevated ALT of 745, right upper quadrant ultrasound which revealed cholelithiasis with sonographic Percell Miller sign concerning for possible acute cholecystitis. Case was discussed with Dr. [name], GI who recommended obtaining an MRCP. Hospitalist group has now been called to assess patient for admission to the hospital.     Today, patient reports mild postop abdominal pain, denies any nausea/vomiting, fever/chills, chest pain, shortness of breath.  Patient stable to be discharged to follow-up with general surgery as scheduled and PCP in 1 week  Hospital Course:  Principal  Problem:   Acute gallstone pancreatitis Active Problems:   Acute cholecystitis   Hypothyroidism, postsurgical   Biliary pancreatitis s/p laparoscopic cholecystectomy with intraoperative cholangiogram on 09/10/19 Noted transaminitis with downward trend Right upper quadrant ultrasound concerning for possible acute cholecystitis GI consulted, MRCP showed pancreatitis, no no CBD stone General surgery consulted s/p cholecystectomy and IOC with no evidence of choledocholithiasis on 09/10/2019 Follow-up with PCP in 1 week, and general surgery as scheduled  Hypothyroidism Continue Synthroid       Malnutrition Type:      Malnutrition Characteristics:      Nutrition Interventions:      Estimated body mass index is 28.13 kg/m as calculated from the following:   Height as of this encounter: 5\' 8"  (1.727 m).   Weight as of this encounter: 83.9 kg.    Procedures: laparoscopic cholecystectomy with intraoperative cholangiogram on 09/10/19  Consultations:  General surgery  GI  Discharge Exam: BP (!) 108/59 (BP Location: Left Arm)   Pulse 68   Temp 98.6 F (37 C) (Oral)   Resp 18   Ht 5\' 8"  (1.727 m)   Wt 83.9 kg   SpO2 95%   BMI 28.13 kg/m   General: NAD Cardiovascular: S1, S2 present Respiratory: CTA B Abdomen: Soft, tender around lap incision sites, nondistended, bowel sounds present    Discharge Instructions You were cared for by a hospitalist during your hospital stay. If you have any questions about your discharge medications or the care you received while you were in the hospital after you are discharged, you can call the unit and asked to speak with the hospitalist on call if the hospitalist that took care of  you is not available. Once you are discharged, your primary care physician will handle any further medical issues. Please note that NO REFILLS for any discharge medications will be authorized once you are discharged, as it is imperative that you return to  your primary care physician (or establish a relationship with a primary care physician if you do not have one) for your aftercare needs so that they can reassess your need for medications and monitor your lab values.  Discharge Instructions    Call MD for:  difficulty breathing, headache or visual disturbances   Complete by: As directed    Call MD for:  persistant nausea and vomiting   Complete by: As directed    Call MD for:  redness, tenderness, or signs of infection (pain, swelling, redness, odor or green/yellow discharge around incision site)   Complete by: As directed    Call MD for:  severe uncontrolled pain   Complete by: As directed    Call MD for:  temperature >100.4   Complete by: As directed    Diet - low sodium heart healthy   Complete by: As directed    Increase activity slowly   Complete by: As directed    Increase activity slowly   Complete by: As directed      Allergies as of 09/11/2019   No Known Allergies     Medication List    TAKE these medications   acetaminophen 500 MG tablet Commonly known as: TYLENOL Take 1,000 mg by mouth daily as needed for mild pain or moderate pain.   ALPRAZolam 0.5 MG tablet Commonly known as: XANAX Take 0.5 mg by mouth 2 (two) times daily as needed for anxiety.   Calcium-Carb 600 600 MG Tabs tablet Generic drug: calcium carbonate Take 1 tablet by mouth daily.   clotrimazole-betamethasone lotion Commonly known as: LOTRISONE Apply 1 application topically 2 (two) times daily as needed for rash.   fluticasone 50 MCG/ACT nasal spray Commonly known as: FLONASE Place 1 spray into the nose at bedtime as needed for allergies.   glucosamine-chondroitin 500-400 MG tablet Take 2 tablets by mouth.   ibuprofen 200 MG tablet Commonly known as: ADVIL Take 400-600 mg by mouth every 8 (eight) hours as needed for fever or moderate pain. What changed: Another medication with the same name was added. Make sure you understand how and when  to take each.   ibuprofen 800 MG tablet Commonly known as: ADVIL Take 1 tablet (800 mg total) by mouth every 8 (eight) hours as needed. What changed: You were already taking a medication with the same name, and this prescription was added. Make sure you understand how and when to take each.   levothyroxine 125 MCG tablet Commonly known as: SYNTHROID Take 0.5-1 tablets by mouth See admin instructions. Take 1 tablet a day except for Sunday, take 1/2 tablet.   omega-3 fish oil 1000 MG Caps capsule Commonly known as: MAXEPA Take 2 capsules by mouth daily.   oxyCODONE 5 MG immediate release tablet Commonly known as: Oxy IR/ROXICODONE Take 1 tablet (5 mg total) by mouth every 6 (six) hours as needed for breakthrough pain.   psyllium 0.52 g capsule Commonly known as: REGULOID Take 2 capsules by mouth daily.   Vitamin D3 125 MCG (5000 UT) Caps Take 1 capsule by mouth daily.      No Known Allergies Follow-up Information    [facility] [place] Follow up on 09/29/2019.   Specialty: General Surgery Why: 10:15 am, arrive by  9:45am for paperwork and check in process  Contact information: [address] [place] [address] [phone]        Nona Dell, Corene Cornea, MD. Schedule an appointment as soon as possible for a visit in 1 week(s).   Specialty: Internal Medicine Contact information: [place] [address] [phone]            The results of significant diagnostics from this hospitalization (including imaging, microbiology, ancillary and laboratory) are listed below for reference.    Significant Diagnostic Studies: DG Cholangiogram Operative  Result Date: 09/10/2019 CLINICAL DATA:  Intraoperative cholangiogram during laparoscopic cholecystectomy. EXAM: INTRAOPERATIVE CHOLANGIOGRAM FLUOROSCOPY TIME:  11 seconds COMPARISON:  Abdominal MRI-09/09/2019 FINDINGS: Intraoperative cholangiographic images of the right upper abdominal quadrant during  laparoscopic cholecystectomy are provided for review. Surgical clips overlie the expected location of the gallbladder fossa. Contrast injection demonstrates selective cannulation of the central aspect of the cystic duct. There is passage of contrast through the central aspect of the cystic duct with filling of a mildly dilated common bile duct. There is passage of contrast though the CBD and into the descending portion of the duodenum. There is minimal reflux of injected contrast into the common hepatic duct and central aspect of the non dilated intrahepatic biliary system. There is minimal opacification of the central aspect of the pancreatic duct which appears nondilated. There are no discrete filling defects within the opacified portions of the biliary system to suggest the presence of choledocholithiasis. IMPRESSION: 1. No evidence of choledocholithiasis. 2. Minimal opacification of the pancreatic duct which appears nondilated. Electronically Signed   By: Sandi Mariscal M.D.   On: 09/10/2019 16:16   CT ABDOMEN PELVIS W CONTRAST  Result Date: 09/08/2019 CLINICAL DATA:  Epigastric pain and nausea EXAM: CT ABDOMEN AND PELVIS WITH CONTRAST TECHNIQUE: Multidetector CT imaging of the abdomen and pelvis was performed using the standard protocol following bolus administration of intravenous contrast. CONTRAST:  132mL OMNIPAQUE IOHEXOL 300 MG/ML  SOLN COMPARISON:  None. FINDINGS: LOWER CHEST: Normal. HEPATOBILIARY: Normal hepatic contours. No intra- or extrahepatic biliary dilatation. The gallbladder is normal. PANCREAS: Large amount of inflammatory stranding adjacent to the body and tail the pancreas. No peripancreatic fluid collection. No parenchymal pancreatic abnormality. Pancreatic duct is normal. SPLEEN: Normal. ADRENALS/URINARY TRACT: The adrenal glands are normal. No hydronephrosis, nephroureterolithiasis or solid renal mass. The urinary bladder is normal for degree of distention STOMACH/BOWEL: There is no  hiatal hernia. Normal duodenal course and caliber. No small bowel dilatation or inflammation. No focal colonic abnormality. Normal appendix. VASCULAR/LYMPHATIC: Normal course and caliber of the major abdominal vessels. No abdominal or pelvic lymphadenopathy. REPRODUCTIVE: Status post hysterectomy. No adnexal mass. MUSCULOSKELETAL. No bony spinal canal stenosis or focal osseous abnormality. OTHER: None. IMPRESSION: Acute pancreatitis without peripancreatic fluid collection or evidence of pancreatic necrosis. Electronically Signed   By: Ulyses Jarred M.D.   On: 09/08/2019 23:17   MR ABDOMEN MRCP W WO CONTAST  Result Date: 09/09/2019 CLINICAL DATA:  Epigastric pain and nausea for 2 days. EXAM: MRI ABDOMEN WITHOUT AND WITH CONTRAST (INCLUDING MRCP) TECHNIQUE: Multiplanar multisequence MR imaging of the abdomen was performed both before and after the administration of intravenous contrast. Heavily T2-weighted images of the biliary and pancreatic ducts were obtained, and three-dimensional MRCP images were rendered by post processing. CONTRAST:  7.42mL GADAVIST GADOBUTROL 1 MMOL/ML IV SOLN COMPARISON:  Ultrasound and CT 09/08/1998 FINDINGS: Lower chest:  Lung bases are clear. Hepatobiliary: Several large gallstones and multiple  small gallstones collect in the neck of the gallbladder. Gallbladder measures 3.7 cm diameter. No gallbladder wall thickening. No comparison cholecystic fluid. No clear gallbladder inflammation. The common hepatic duct and common bile duct are prominent and mildly dilated with the common bile duct measuring 8 mm in diameter. No filling defects within the common bile duct. Common bile duct is normal caliber through the pancreatic head measuring 4 mm and tapers smoothly to the ampulla (image 23/series 14/MRCP sequence) Postcontrast enhanced imaging demonstrates no hepatic lesion. Pancreas: Mild edema in the body and tail of the pancreas. Pancreatic duct is normal caliber. There is no variant  pancreatic ductal anatomy. Postcontrast imaging demonstrates uniform enhancement of the pancreas. On T2 weighted imaging there is peripancreatic fluid (image 6/series 16). This fluid extends along the body and tail primarily. Small amount of fluid also collects in the anterior to the LEFT pararenal space. Edema extends into the central mesentery also (image 29/series 4). Spleen: Normal spleen. Adrenals/urinary tract: Adrenal glands and kidneys are normal. Stomach/Bowel: Stomach and limited of the small bowel is unremarkable Vascular/Lymphatic: No upper abdominal adenopathy.  Abdominal aorta Musculoskeletal: No aggressive osseous lesion IMPRESSION: 1. Findings consistent acute pancreatitis with edema surrounding the body and tail the pancreas and extending into the mesentery along the LEFT pararenal space. No organized fluid collections. No pancreatic necrosis. 2. No variant pancreatic ductal anatomy. 3. Multiple large and small gallstones collect towards the neck of the gallbladder however no clear evidence of acute cholecystitis. No choledocholithiasis. 4. Potential gallstone pancreatitis with passage a gallstone through the common bile duct. Other etiologies of pancreatitis should also be considered. Electronically Signed   By: Suzy Bouchard M.D.   On: 09/09/2019 07:48   US Abdomen Limited RUQ  Result Date: 09/08/2019 CLINICAL DATA:  Right upper quadrant pain EXAM: ULTRASOUND ABDOMEN LIMITED RIGHT UPPER QUADRANT COMPARISON:  None. FINDINGS: Gallbladder: Multiple gallstones. Positive sonographic Percell Miller sign was reported by the sonographer. No gallbladder wall thickening or pericholecystic fluid. Common bile duct: Diameter: 4 mm Liver: No focal lesion identified. Within normal limits in parenchymal echogenicity. Portal vein is patent on color Doppler imaging with normal direction of blood flow towards the liver. Other: None. IMPRESSION: Cholelithiasis with a positive sonographic Murphy sign. This is  compatible with acute cholecystitis in the appropriate context. However, the findings on the concomitant CT of the abdomen and pelvis are more consistent with acute pancreatitis. Electronically Signed   By: Ulyses Jarred M.D.   On: 09/08/2019 23:22    Microbiology: Recent Results (from the past 240 hour(s))  SARS Coronavirus 2 by RT PCR (hospital order, performed in [facility] hospital lab) Nasopharyngeal Nasopharyngeal Swab     Status: None   Collection Time: 09/08/19 11:25 PM   Specimen: Nasopharyngeal Swab  Result Value Ref Range Status   SARS Coronavirus 2 NEGATIVE NEGATIVE Final    Comment: (NOTE) SARS-CoV-2 target nucleic acids are NOT DETECTED. The SARS-CoV-2 RNA is generally detectable in upper and lower respiratory specimens during the acute phase of infection. The lowest concentration of SARS-CoV-2 viral copies this assay can detect is 250 copies / mL. A negative result does not preclude SARS-CoV-2 infection and should not be used as the sole basis for treatment or other patient management decisions.  A negative result may occur with improper specimen collection / handling, submission of specimen other than nasopharyngeal swab, presence of viral mutation(s) within the areas targeted by this assay, and inadequate number of viral copies (<250 copies / mL). A negative result  must be combined with clinical observations, patient history, and epidemiological information. Fact Sheet for Patients:   StrictlyIdeas.no Fact Sheet for Healthcare Providers: BankingDealers.co.za This test is not yet approved or cleared  by the [place] FDA and has been authorized for detection and/or diagnosis of SARS-CoV-2 by FDA under an Emergency Use Authorization (EUA).  This EUA will remain in effect (meaning this test can be used) for the duration of the COVID-19 declaration under Section 564(b)(1) of the Act, 21 U.S.C. section 360bbb-3(b)(1),  unless the authorization is terminated or revoked sooner. Performed at [facility], [place] [address], [place], [place] [address]   Culture, blood (routine x 2)     Status: None (Preliminary result)   Collection Time: 09/09/19  5:45 AM   Specimen: BLOOD LEFT HAND  Result Value Ref Range Status   Specimen Description BLOOD LEFT HAND  Final   Special Requests AEROBIC BOTTLE ONLY Blood Culture adequate volume  Final   Culture   Final    NO GROWTH 1 DAY Performed at [facility], [place] [address], [place], [place] [address]    Report Status PENDING  Incomplete  Culture, blood (routine x 2)     Status: None (Preliminary result)   Collection Time: 09/09/19  6:52 AM   Specimen: BLOOD RIGHT HAND  Result Value Ref Range Status   Specimen Description BLOOD RIGHT HAND  Final   Special Requests   Final    BOTTLES DRAWN AEROBIC AND ANAEROBIC Blood Culture adequate volume   Culture   Final    NO GROWTH 1 DAY Performed at [facility], [place] [address] [place], [place] [address]    Report Status PENDING  Incomplete  Surgical pcr screen     Status: Abnormal   Collection Time: 09/10/19  7:52 AM   Specimen: Nasal Mucosa; Nasal Swab  Result Value Ref Range Status   MRSA, PCR NEGATIVE NEGATIVE Final   Staphylococcus aureus POSITIVE (A) NEGATIVE Final    Comment: (NOTE) The Xpert SA Assay (FDA approved for NASAL specimens in patients 59 years of age and older), is one component of a comprehensive surveillance program. It is not intended to diagnose infection nor to guide or monitor treatment. Performed at [facility], [place] [address], [place], [place] [address]      Labs: Basic Metabolic Panel: Recent Labs  Lab 09/08/19 1937 09/09/19 [id number] 09/10/19 0317 09/11/19 0634  NA 139 141 141 140  K 4.5 3.9 3.8 3.6  CL 104 107 107 107  CO2 21* 21* 23 23  GLUCOSE 169* 122* 106* 104*  BUN 11 10 <5* 9  CREATININE 0.90 0.78 0.73 0.74  CALCIUM 10.0 8.6* 8.3* 8.5*  MG  --    --  1.7  --    Liver Function Tests: Recent Labs  Lab 09/08/19 1937 09/09/19 0638 09/10/19 0317 09/11/19 0634  AST 421* 190* 63* 71*  ALT 745* 483* 252* 205*  ALKPHOS 237* 174* 122 106  BILITOT 1.3* 1.1 0.9 1.1  PROT 7.8 6.0* 5.0* 5.2*  ALBUMIN 4.5 3.4* 2.8* 2.6*   Recent Labs  Lab 09/08/19 1937 09/10/19 0317  LIPASE 2,881* 73*   No results for input(s): AMMONIA in the last 168 hours. CBC: Recent Labs  Lab 09/08/19 1937 09/09/19 0638 09/10/19 0317 09/11/19 0634  WBC 14.8* 11.2* 10.4 9.8  NEUTROABS  --  9.5* 7.7 7.8*  HGB 17.7* 14.9 13.0 12.2  HCT 54.4* 45.9 39.6 37.4  MCV 94.9  93.9 93.8 93.3  PLT 261 229 213 200   Cardiac Enzymes: No results for input(s): CKTOTAL, CKMB, CKMBINDEX, TROPONINI in the last 168 hours. BNP: BNP (last 3 results) No results for input(s): BNP in the last 8760 hours.  ProBNP (last 3 results) No results for input(s): PROBNP in the last 8760 hours.  CBG: No results for input(s): GLUCAP in the last 168 hours.     Signed:  Alma Friendly, MD [facility] 09/11/2019, 11:02 AM

## 2019-09-14 LAB — CULTURE, BLOOD (ROUTINE X 2)
Culture: NO GROWTH
Culture: NO GROWTH
Special Requests: ADEQUATE
Special Requests: ADEQUATE

## 2019-09-25 DIAGNOSIS — N762 Acute vulvitis: Secondary | ICD-10-CM | POA: Diagnosis not present

## 2020-01-19 DIAGNOSIS — E039 Hypothyroidism, unspecified: Secondary | ICD-10-CM | POA: Diagnosis not present

## 2020-01-19 DIAGNOSIS — F419 Anxiety disorder, unspecified: Secondary | ICD-10-CM | POA: Diagnosis not present

## 2020-01-19 DIAGNOSIS — Z6826 Body mass index (BMI) 26.0-26.9, adult: Secondary | ICD-10-CM | POA: Diagnosis not present

## 2020-01-19 DIAGNOSIS — Z85850 Personal history of malignant neoplasm of thyroid: Secondary | ICD-10-CM | POA: Diagnosis not present

## 2020-01-19 DIAGNOSIS — Z0000 Encounter for general adult medical examination without abnormal findings: Secondary | ICD-10-CM | POA: Diagnosis not present

## 2020-01-19 DIAGNOSIS — Z1331 Encounter for screening for depression: Secondary | ICD-10-CM | POA: Diagnosis not present

## 2020-02-15 DIAGNOSIS — E890 Postprocedural hypothyroidism: Secondary | ICD-10-CM | POA: Diagnosis not present

## 2020-02-15 DIAGNOSIS — C73 Malignant neoplasm of thyroid gland: Secondary | ICD-10-CM | POA: Diagnosis not present

## 2020-05-10 DIAGNOSIS — G4700 Insomnia, unspecified: Secondary | ICD-10-CM | POA: Diagnosis not present

## 2021-01-26 DIAGNOSIS — Z0000 Encounter for general adult medical examination without abnormal findings: Secondary | ICD-10-CM | POA: Diagnosis not present

## 2021-01-26 DIAGNOSIS — Z1331 Encounter for screening for depression: Secondary | ICD-10-CM | POA: Diagnosis not present

## 2021-01-26 DIAGNOSIS — E039 Hypothyroidism, unspecified: Secondary | ICD-10-CM | POA: Diagnosis not present

## 2021-01-26 DIAGNOSIS — Z6826 Body mass index (BMI) 26.0-26.9, adult: Secondary | ICD-10-CM | POA: Diagnosis not present

## 2021-01-26 DIAGNOSIS — F411 Generalized anxiety disorder: Secondary | ICD-10-CM | POA: Diagnosis not present

## 2021-01-26 DIAGNOSIS — Z85850 Personal history of malignant neoplasm of thyroid: Secondary | ICD-10-CM | POA: Diagnosis not present

## 2021-02-07 DIAGNOSIS — Z1231 Encounter for screening mammogram for malignant neoplasm of breast: Secondary | ICD-10-CM | POA: Diagnosis not present

## 2021-02-15 DIAGNOSIS — C73 Malignant neoplasm of thyroid gland: Secondary | ICD-10-CM | POA: Diagnosis not present

## 2021-02-15 DIAGNOSIS — E890 Postprocedural hypothyroidism: Secondary | ICD-10-CM | POA: Diagnosis not present

## 2021-06-18 ENCOUNTER — Encounter (HOSPITAL_COMMUNITY): Payer: Self-pay | Admitting: Emergency Medicine

## 2021-06-18 ENCOUNTER — Other Ambulatory Visit: Payer: Self-pay

## 2021-06-18 ENCOUNTER — Emergency Department (HOSPITAL_COMMUNITY): Payer: BC Managed Care – PPO

## 2021-06-18 ENCOUNTER — Emergency Department (HOSPITAL_COMMUNITY)
Admission: EM | Admit: 2021-06-18 | Discharge: 2021-06-18 | Disposition: A | Discharge status: Home / Self Care | Payer: BC Managed Care – PPO | Attending: Emergency Medicine | Admitting: Emergency Medicine

## 2021-06-18 DIAGNOSIS — K529 Noninfective gastroenteritis and colitis, unspecified: Secondary | ICD-10-CM

## 2021-06-18 DIAGNOSIS — R062 Wheezing: Secondary | ICD-10-CM | POA: Diagnosis not present

## 2021-06-18 DIAGNOSIS — R000 Tachycardia, unspecified: Secondary | ICD-10-CM | POA: Diagnosis not present

## 2021-06-18 DIAGNOSIS — R112 Nausea with vomiting, unspecified: Secondary | ICD-10-CM | POA: Diagnosis not present

## 2021-06-18 DIAGNOSIS — Z20822 Contact with and (suspected) exposure to covid-19: Secondary | ICD-10-CM | POA: Diagnosis not present

## 2021-06-18 DIAGNOSIS — R1110 Vomiting, unspecified: Secondary | ICD-10-CM | POA: Diagnosis not present

## 2021-06-18 DIAGNOSIS — R109 Unspecified abdominal pain: Secondary | ICD-10-CM | POA: Diagnosis not present

## 2021-06-18 LAB — URINALYSIS, ROUTINE W REFLEX MICROSCOPIC
Bilirubin Urine: NEGATIVE
Glucose, UA: NEGATIVE mg/dL
Ketones, ur: NEGATIVE mg/dL
Leukocytes,Ua: NEGATIVE
Nitrite: NEGATIVE
Protein, ur: NEGATIVE mg/dL
Specific Gravity, Urine: 1.02 (ref 1.005–1.030)
pH: 6 (ref 5.0–8.0)

## 2021-06-18 LAB — CBC
HCT: 46.4 % — ABNORMAL HIGH (ref 36.0–46.0)
Hemoglobin: 15.2 g/dL — ABNORMAL HIGH (ref 12.0–15.0)
MCH: 29.1 pg (ref 26.0–34.0)
MCHC: 32.8 g/dL (ref 30.0–36.0)
MCV: 88.9 fL (ref 80.0–100.0)
Platelets: 293 10*3/uL (ref 150–400)
RBC: 5.22 MIL/uL — ABNORMAL HIGH (ref 3.87–5.11)
RDW: 13.3 % (ref 11.5–15.5)
WBC: 6.7 10*3/uL (ref 4.0–10.5)
nRBC: 0 % (ref 0.0–0.2)

## 2021-06-18 LAB — COMPREHENSIVE METABOLIC PANEL
ALT: 29 U/L (ref 0–44)
AST: 31 U/L (ref 15–41)
Albumin: 4 g/dL (ref 3.5–5.0)
Alkaline Phosphatase: 60 U/L (ref 38–126)
Anion gap: 12 (ref 5–15)
BUN: 12 mg/dL (ref 6–20)
CO2: 23 mmol/L (ref 22–32)
Calcium: 8.9 mg/dL (ref 8.9–10.3)
Chloride: 103 mmol/L (ref 98–111)
Creatinine, Ser: 0.78 mg/dL (ref 0.44–1.00)
GFR, Estimated: >60 mL/min (ref >60)
Glucose, Bld: 120 mg/dL — ABNORMAL HIGH (ref 70–99)
Potassium: 3.8 mmol/L (ref 3.5–5.1)
Sodium: 138 mmol/L (ref 135–145)
Total Bilirubin: 0.5 mg/dL (ref 0.3–1.2)
Total Protein: 6.7 g/dL (ref 6.5–8.1)

## 2021-06-18 LAB — TROPONIN I (HIGH SENSITIVITY): Troponin I (High Sensitivity): 4 ng/L (ref <18)

## 2021-06-18 LAB — RESP PANEL BY RT-PCR (FLU A&B, COVID) ARPGX2
Influenza A by PCR: NEGATIVE
Influenza B by PCR: NEGATIVE
SARS Coronavirus 2 by RT PCR: NEGATIVE

## 2021-06-18 LAB — LACTIC ACID, PLASMA: Lactic Acid, Venous: 0.9 mmol/L (ref 0.5–1.9)

## 2021-06-18 LAB — URINALYSIS, MICROSCOPIC (REFLEX)

## 2021-06-18 LAB — LIPASE, BLOOD: Lipase: 29 U/L (ref 11–51)

## 2021-06-18 MED ORDER — ONDANSETRON HCL 4 MG PO TABS: 4.0000 mg | ORAL_TABLET | Freq: Four times a day (QID) | ORAL | 0 refills | Status: DC

## 2021-06-18 MED ORDER — MORPHINE SULFATE (PF) 4 MG/ML IV SOLN
4.0000 mg | Freq: Once | INTRAVENOUS | Status: AC
  Administered 2021-06-18: 4 mg via INTRAVENOUS
  Filled 2021-06-18: qty 1

## 2021-06-18 MED ORDER — IOHEXOL 350 MG/ML SOLN
75.0000 mL | Freq: Once | INTRAVENOUS | Status: AC | PRN
  Administered 2021-06-18: 75 mL via INTRAVENOUS

## 2021-06-18 MED ORDER — HYDROCODONE-ACETAMINOPHEN 5-325 MG PO TABS: 1.0000 | ORAL_TABLET | Freq: Four times a day (QID) | ORAL | 0 refills | Status: DC | PRN

## 2021-06-18 MED ORDER — DIPHENHYDRAMINE HCL 50 MG/ML IJ SOLN
12.5000 mg | Freq: Once | INTRAMUSCULAR | Status: AC
  Administered 2021-06-18: 12.5 mg via INTRAVENOUS
  Filled 2021-06-18: qty 1

## 2021-06-18 MED ORDER — DICYCLOMINE HCL 20 MG PO TABS: 20.0000 mg | ORAL_TABLET | Freq: Two times a day (BID) | ORAL | 0 refills | Status: DC

## 2021-06-18 MED ORDER — DICYCLOMINE HCL 10 MG/ML IM SOLN
20.0000 mg | Freq: Once | INTRAMUSCULAR | Status: AC
  Administered 2021-06-18: 20 mg via INTRAMUSCULAR
  Filled 2021-06-18: qty 2

## 2021-06-18 MED ORDER — SODIUM CHLORIDE 0.9 % IV BOLUS
500.0000 mL | Freq: Once | INTRAVENOUS | Status: AC
  Administered 2021-06-18: 500 mL via INTRAVENOUS

## 2021-06-18 MED ORDER — ONDANSETRON HCL 4 MG/2ML IJ SOLN
4.0000 mg | Freq: Once | INTRAMUSCULAR | Status: AC
  Administered 2021-06-18: 4 mg via INTRAVENOUS
  Filled 2021-06-18: qty 2

## 2021-06-18 MED ORDER — SODIUM CHLORIDE 0.9 % IV BOLUS
1000.0000 mL | Freq: Once | INTRAVENOUS | Status: AC
  Administered 2021-06-18: 1000 mL via INTRAVENOUS

## 2021-06-18 NOTE — ED Provider Notes (Signed)
[facility] EMERGENCY DEPARTMENT Provider Note   CSN: [id number] Arrival date & time: 06/18/21  1058     History  Chief Complaint  Patient presents with   Emesis   Abdominal Pain    Jordan Pollard is a 51 y.o. female.  HPI   Pt is a 51 y/o female with a h/o thyroid ca s/p resection, gallstone pancreatitis, cholecystectomy, hysterectomy, who presents to the ED today for eval of nausea vomiting that started yesterday. Today she reports feeling generalized malaise, nausea, abd cramping/epigastric abd pain and belching.   States she denies diarrhea, constipation, dysuria, frequency, urgency. States she had a temp of 100.36F yesterday. Denies fevers today.   Pt had a covid exposure last week  Home Medications Prior to Admission medications   Medication Sig Start Date End Date Taking? Authorizing Provider  acetaminophen (TYLENOL) 500 MG tablet Take 1,000 mg by mouth daily as needed for mild pain or moderate pain.   Yes [provider]  ALPRAZolam Duanne Moron) 0.5 MG tablet Take 0.5 mg by mouth 2 (two) times daily as needed for anxiety. 04/20/19  Yes [provider]  calcium carbonate (OS-CAL) 600 MG TABS tablet Take 1 tablet by mouth daily.   Yes [provider]  Cholecalciferol (VITAMIN D3) 125 MCG (5000 UT) CAPS Take 1 capsule by mouth daily.   Yes [provider]  clotrimazole-betamethasone (LOTRISONE) lotion Apply 1 application topically 2 (two) times daily as needed for rash. 08/05/19  Yes [provider]  dicyclomine (BENTYL) 20 MG tablet Take 1 tablet (20 mg total) by mouth 2 (two) times daily. 06/18/21  Yes Koal Eslinger S, PA-C  fluticasone (FLONASE) 50 MCG/ACT nasal spray Place 1 spray into the nose at bedtime as needed for allergies.   Yes [provider]  glucosamine-chondroitin 500-400 MG tablet Take 2 tablets by mouth.   Yes [provider]  HYDROcodone-acetaminophen (NORCO/VICODIN) 5-325 MG  tablet Take 1 tablet by mouth every 6 (six) hours as needed. 06/18/21  Yes Wyndell Cardiff S, PA-C  ibuprofen (ADVIL) 200 MG tablet Take 400-600 mg by mouth every 8 (eight) hours as needed for fever or moderate pain.   Yes [provider]  levothyroxine (SYNTHROID) 125 MCG tablet Take 0.5-1 tablets by mouth See admin instructions. Take 1 tablet a day except for Sunday, take 1/2 tablet. 08/19/19  Yes [provider]  MAGNESIUM PO Take 1 tablet by mouth daily.   Yes [provider]  Multiple Vitamin (MULTIVITAMIN WITH MINERALS) TABS tablet Take 1 tablet by mouth daily.   Yes [provider]  omega-3 fish oil (MAXEPA) 1000 MG CAPS capsule Take 2 capsules by mouth daily.   Yes [provider]  ondansetron (ZOFRAN) 4 MG tablet Take 1 tablet (4 mg total) by mouth every 6 (six) hours. 06/18/21  Yes Berkleigh Beckles S, PA-C  Simethicone (GAS-X PO) Take 1 capsule by mouth as needed (gas relief).   Yes [provider]      Allergies    Patient has no known allergies.    Review of Systems   Review of Systems See HPI for pertinent positives or negatives.   Physical Exam Updated Vital Signs BP 116/61    Pulse 69    Temp 99 F (37.2 C) (Oral)    Resp 14    SpO2 99%  Physical Exam Vitals and nursing note reviewed.  Constitutional:      General: She is not in acute distress.    Appearance:  She is well-developed.  HENT:     Head: Normocephalic and atraumatic.     Mouth/Throat:     Mouth: Mucous membranes are dry.  Eyes:     Conjunctiva/sclera: Conjunctivae normal.  Cardiovascular:     Rate and Rhythm: Regular rhythm. Tachycardia present.     Heart sounds: No murmur heard. Pulmonary:     Effort: Pulmonary effort is normal. No respiratory distress.     Breath sounds: Wheezing (rare expiratory wheeze) present.  Abdominal:     General: Bowel sounds are normal.     Palpations: Abdomen is soft.     Tenderness: There is no abdominal tenderness.  There is no guarding or rebound.  Musculoskeletal:        General: No swelling.     Cervical back: Neck supple.  Skin:    General: Skin is warm and dry.     Capillary Refill: Capillary refill takes less than 2 seconds.  Neurological:     Mental Status: She is alert.  Psychiatric:        Mood and Affect: Mood normal.     ED Results / Procedures / Treatments   Labs (all labs ordered are listed, but only abnormal results are displayed) Labs Reviewed  COMPREHENSIVE METABOLIC PANEL - Abnormal; Notable for the following components:      Result Value   Glucose, Bld 120 (*)    All other components within normal limits  CBC - Abnormal; Notable for the following components:   RBC 5.22 (*)    Hemoglobin 15.2 (*)    HCT 46.4 (*)    All other components within normal limits  URINALYSIS, ROUTINE W REFLEX MICROSCOPIC - Abnormal; Notable for the following components:   Hgb urine dipstick TRACE (*)    All other components within normal limits  URINALYSIS, MICROSCOPIC (REFLEX) - Abnormal; Notable for the following components:   Bacteria, UA RARE (*)    All other components within normal limits  RESP PANEL BY RT-PCR (FLU A&B, COVID) ARPGX2  CULTURE, BLOOD (ROUTINE X 2)  CULTURE, BLOOD (ROUTINE X 2)  LIPASE, BLOOD  LACTIC ACID, PLASMA  TROPONIN I (HIGH SENSITIVITY)    EKG None  Radiology CT ABDOMEN PELVIS W CONTRAST  Result Date: 06/18/2021 CLINICAL DATA:  51 year old female with acute abdominal pain and vomiting for 1 day. EXAM: CT ABDOMEN AND PELVIS WITH CONTRAST TECHNIQUE: Multidetector CT imaging of the abdomen and pelvis was performed using the standard protocol following bolus administration of intravenous contrast. RADIATION DOSE REDUCTION: This exam was performed according to the departmental dose-optimization program which includes automated exposure control, adjustment of the mA and/or kV according to patient size and/or use of iterative reconstruction technique. CONTRAST:  51mL  OMNIPAQUE IOHEXOL 350 MG/ML SOLN COMPARISON:  09/08/2019 CT, 09/09/2019 MR and prior studies FINDINGS: Lower chest: No acute abnormality. Hepatobiliary: The liver is unremarkable. The patient is status post cholecystectomy. There is no evidence of intrahepatic or extrahepatic biliary dilatation. Pancreas: Unremarkable Spleen: Unremarkable Adrenals/Urinary Tract: The kidneys, adrenal glands and bladder are unremarkable. Stomach/Bowel: There are multiple small bowel loops filled with fluid, UPPER limits of normal in caliber with equivocal mild wall enhancement, which may represent an enteritis. There is no evidence of bowel obstruction, other areas of bowel wall thickening or inflammatory changes. The appendix is unremarkable. Vascular/Lymphatic: No significant vascular findings are present. No enlarged abdominal or pelvic lymph nodes. Reproductive: Status post hysterectomy. No adnexal masses. Other: No ascites, focal collection or pneumoperitoneum. Musculoskeletal: No acute or suspicious bony  abnormalities are noted. IMPRESSION: 1. Multiple UPPER limits of normal caliber small bowel loops filled with fluid with equivocal mild wall enhancement-possibly representing an enteritis. No evidence of bowel obstruction or pneumoperitoneum. 2. No other acute abnormalities. Electronically Signed   By: Margarette Canada M.D.   On: 06/18/2021 12:55    Procedures Procedures    Medications Ordered in ED Medications  ondansetron (ZOFRAN) injection 4 mg (4 mg Intravenous Given 06/18/21 1151)  sodium chloride 0.9 % bolus 1,000 mL (0 mLs Intravenous Stopped 06/18/21 1415)  dicyclomine (BENTYL) injection 20 mg (20 mg Intramuscular Given 06/18/21 1152)  iohexol (OMNIPAQUE) 350 MG/ML injection 75 mL (75 mLs Intravenous Contrast Given 06/18/21 1230)  morphine (PF) 4 MG/ML injection 4 mg (4 mg Intravenous Given 06/18/21 1258)  sodium chloride 0.9 % bolus 500 mL (0 mLs Intravenous Stopped 06/18/21 1415)  ondansetron (ZOFRAN) injection 4 mg  (4 mg Intravenous Given 06/18/21 1329)  diphenhydrAMINE (BENADRYL) injection 12.5 mg (12.5 mg Intravenous Given 06/18/21 1403)    ED Course/ Medical Decision Making/ A&P                           Medical Decision Making Amount and/or Complexity of Data Reviewed Labs: ordered. Radiology: ordered.  Risk Prescription drug management.   This patient presents to the ED for concern of nv, hypotension, this involves an extensive number of treatment options, and is a complaint that carries with it a high risk of complications and morbidity.  The differential diagnosis includes but is not limited to gastritis/PUD, enteritis/duodenitis, appendicitis, cholelithiasis/cholecystitis, cholangitis, pancreatitis, ruptured viscus, colitis, diverticulitis, proctitis, cystitis, pyelonephritis, ureteral colic, aortic dissection, aortic aneurysm. In women, ectopic pregnancy, pelvic inflammatory disease, ovarian cysts, and tubo-ovarian abscess were also considered. Atypical chest etiologies were also considered including ACS, PE, and pneumonia.    Comorbidities that complicate the patient evaluation: Patients presentation is complicated by their history of thyroid carcinoma, gallstone pancreatitis  Additional history obtained: Additional history obtained from  friend at bedside Records reviewed Care Everywhere/External Records and urgent care note  Lab Tests: I Ordered, and personally interpreted labs.  The pertinent results include:   CBC w/o leukocytosis, elevated hgb, likely secondary to hemoconcentration from dehydration CMP grossly unremarkable Lipase negative UA with trace hgb, otherwise reassuring. Trop negative Lactic negative Blood cultures COVID/flu negative  EKG - sinus tach, nonspecific twave abnormality  Imaging Studies ordered: I ordered, independently visualized, and interpreted imaging which showed  CT abd/pelvis - 1. Multiple UPPER limits of normal caliber small bowel loops filled  with fluid with equivocal mild wall enhancement-possibly representing an enteritis. No evidence of bowel obstruction or pneumoperitoneum. 2. No other acute abnormalities.   - clinically this appears consistent with enteritis  I agree with the radiologist interpretation  Cardiac Monitoring: The patient was maintained on a cardiac monitor.  I personally viewed and interpreted the cardiac monitor which showed an underlying rhythm of:  sinus tachycardia  Medicines ordered and prescription drug management: I ordered medication including antiemetics, bentyl, ivf  for nv, abd cramping, dehydration  Reevaluation of the patient after these medicines showed that the patient    improved  Critical Interventions: antiemetics, bentyl, analgesics  Complexity of problems addressed: Patients presentation is most consistent with  acute complicated illness/injury requiring diagnostic workup  Disposition: After consideration of the diagnostic results and the patients response to treatment,  I feel that the patent would benefit from discharge home with symptomatic treatment. She was able to tolerate po  here in the ed and reports feeling much improved.  .   Discussed findings and plan. Advised pcp f/u and return to the ED if no improvement or worsening sxs. Pt voices understanding of the plan and reasons to return. All questions answered. Pt stable for discharge.    Final Clinical Impression(s) / ED Diagnoses Final diagnoses:  Enteritis    Rx / DC Orders ED Discharge Orders          Ordered    ondansetron (ZOFRAN) 4 MG tablet  Every 6 hours        06/18/21 1355    dicyclomine (BENTYL) 20 MG tablet  2 times daily        06/18/21 1355    HYDROcodone-acetaminophen (NORCO/VICODIN) 5-325 MG tablet  Every 6 hours PRN        06/18/21 1355              Karuna Balducci S, PA-C 06/18/21 1534    Godfrey Pick, MD 06/18/21 2251

## 2021-06-18 NOTE — Discharge Instructions (Addendum)
You were given a prescription for zofran to help with your nausea and bentyl to help with your abdominal cramping. Please take as directed.  Prescription given for Norco. Take medication as directed and do not operate machinery, drive a car, or work while taking this medication as it can make you drowsy. This pain medication can also make you constipated so you should take a stool softener with it such as miralax.  Please follow up with your primary doctor within the next 5-7 days.  If you do not have a primary care provider, information for a healthcare clinic has been provided for you to make arrangements for follow up care. Please return to the ER sooner if you have any new or worsening symptoms, or if you have any of the following symptoms:  Abdominal pain that does not go away.  You have a fever.  You keep throwing up (vomiting).  The pain is felt only in portions of the abdomen. Pain in the right side could possibly be appendicitis. In an adult, pain in the left lower portion of the abdomen could be colitis or diverticulitis.  You pass bloody or black tarry stools.  There is bright red blood in the stool.  The constipation stays for more than 4 days.  There is belly (abdominal) or rectal pain.  You do not seem to be getting better.  You have any questions or concerns.

## 2021-06-18 NOTE — ED Triage Notes (Signed)
Reports nausea, belching, and upper abd pain since yesterday morning.  Vomited x 1 when she woke up yesterday.  Last BM this morning- normal.  Hypotensive on arrival.

## 2021-06-18 NOTE — ED Notes (Signed)
Pt is back from CT

## 2021-06-23 LAB — CULTURE, BLOOD (ROUTINE X 2)
Culture: NO GROWTH
Culture: NO GROWTH

## 2021-09-08 DIAGNOSIS — K635 Polyp of colon: Secondary | ICD-10-CM | POA: Diagnosis not present

## 2021-09-08 DIAGNOSIS — Z1211 Encounter for screening for malignant neoplasm of colon: Secondary | ICD-10-CM | POA: Diagnosis not present

## 2021-09-08 DIAGNOSIS — D124 Benign neoplasm of descending colon: Secondary | ICD-10-CM | POA: Diagnosis not present

## 2021-09-20 DIAGNOSIS — M25569 Pain in unspecified knee: Secondary | ICD-10-CM | POA: Diagnosis not present

## 2021-09-21 DIAGNOSIS — M25561 Pain in right knee: Secondary | ICD-10-CM | POA: Diagnosis not present

## 2021-11-20 DIAGNOSIS — M7989 Other specified soft tissue disorders: Secondary | ICD-10-CM | POA: Diagnosis not present

## 2021-11-20 DIAGNOSIS — M25542 Pain in joints of left hand: Secondary | ICD-10-CM | POA: Diagnosis not present

## 2021-11-20 DIAGNOSIS — M79645 Pain in left finger(s): Secondary | ICD-10-CM | POA: Diagnosis not present

## 2021-12-28 DIAGNOSIS — M1711 Unilateral primary osteoarthritis, right knee: Secondary | ICD-10-CM | POA: Diagnosis not present

## 2022-01-03 DIAGNOSIS — M25561 Pain in right knee: Secondary | ICD-10-CM | POA: Diagnosis not present

## 2022-01-03 DIAGNOSIS — S83241A Other tear of medial meniscus, current injury, right knee, initial encounter: Secondary | ICD-10-CM | POA: Diagnosis not present

## 2022-01-10 DIAGNOSIS — M1711 Unilateral primary osteoarthritis, right knee: Secondary | ICD-10-CM | POA: Diagnosis not present

## 2022-01-10 DIAGNOSIS — S83281A Other tear of lateral meniscus, current injury, right knee, initial encounter: Secondary | ICD-10-CM | POA: Diagnosis not present

## 2022-01-10 DIAGNOSIS — S83241A Other tear of medial meniscus, current injury, right knee, initial encounter: Secondary | ICD-10-CM | POA: Diagnosis not present

## 2022-02-02 DIAGNOSIS — S83281A Other tear of lateral meniscus, current injury, right knee, initial encounter: Secondary | ICD-10-CM | POA: Diagnosis not present

## 2022-02-02 DIAGNOSIS — M23321 Other meniscus derangements, posterior horn of medial meniscus, right knee: Secondary | ICD-10-CM | POA: Diagnosis not present

## 2022-02-02 DIAGNOSIS — S838X1A Sprain of other specified parts of right knee, initial encounter: Secondary | ICD-10-CM | POA: Diagnosis not present

## 2022-02-02 DIAGNOSIS — Z79899 Other long term (current) drug therapy: Secondary | ICD-10-CM | POA: Diagnosis not present

## 2022-02-02 DIAGNOSIS — Z792 Long term (current) use of antibiotics: Secondary | ICD-10-CM | POA: Diagnosis not present

## 2022-02-02 DIAGNOSIS — S83241A Other tear of medial meniscus, current injury, right knee, initial encounter: Secondary | ICD-10-CM | POA: Diagnosis not present

## 2022-02-02 DIAGNOSIS — M23341 Other meniscus derangements, anterior horn of lateral meniscus, right knee: Secondary | ICD-10-CM | POA: Diagnosis not present

## 2022-02-02 DIAGNOSIS — X58XXXA Exposure to other specified factors, initial encounter: Secondary | ICD-10-CM | POA: Diagnosis not present

## 2022-02-02 DIAGNOSIS — M1711 Unilateral primary osteoarthritis, right knee: Secondary | ICD-10-CM | POA: Diagnosis not present

## 2022-02-05 DIAGNOSIS — M25661 Stiffness of right knee, not elsewhere classified: Secondary | ICD-10-CM | POA: Diagnosis not present

## 2022-02-05 DIAGNOSIS — M6281 Muscle weakness (generalized): Secondary | ICD-10-CM | POA: Diagnosis not present

## 2022-02-05 DIAGNOSIS — M25561 Pain in right knee: Secondary | ICD-10-CM | POA: Diagnosis not present

## 2022-02-05 DIAGNOSIS — M25461 Effusion, right knee: Secondary | ICD-10-CM | POA: Diagnosis not present

## 2022-02-12 DIAGNOSIS — M25661 Stiffness of right knee, not elsewhere classified: Secondary | ICD-10-CM | POA: Diagnosis not present

## 2022-02-12 DIAGNOSIS — M6281 Muscle weakness (generalized): Secondary | ICD-10-CM | POA: Diagnosis not present

## 2022-02-12 DIAGNOSIS — M25461 Effusion, right knee: Secondary | ICD-10-CM | POA: Diagnosis not present

## 2022-02-12 DIAGNOSIS — M25561 Pain in right knee: Secondary | ICD-10-CM | POA: Diagnosis not present

## 2022-02-13 DIAGNOSIS — E039 Hypothyroidism, unspecified: Secondary | ICD-10-CM | POA: Diagnosis not present

## 2022-02-13 DIAGNOSIS — Z6829 Body mass index (BMI) 29.0-29.9, adult: Secondary | ICD-10-CM | POA: Diagnosis not present

## 2022-02-13 DIAGNOSIS — F411 Generalized anxiety disorder: Secondary | ICD-10-CM | POA: Diagnosis not present

## 2022-02-13 DIAGNOSIS — Z0000 Encounter for general adult medical examination without abnormal findings: Secondary | ICD-10-CM | POA: Diagnosis not present

## 2022-02-13 DIAGNOSIS — Z23 Encounter for immunization: Secondary | ICD-10-CM | POA: Diagnosis not present

## 2022-02-19 DIAGNOSIS — M25661 Stiffness of right knee, not elsewhere classified: Secondary | ICD-10-CM | POA: Diagnosis not present

## 2022-02-19 DIAGNOSIS — M25561 Pain in right knee: Secondary | ICD-10-CM | POA: Diagnosis not present

## 2022-02-19 DIAGNOSIS — M25461 Effusion, right knee: Secondary | ICD-10-CM | POA: Diagnosis not present

## 2022-02-19 DIAGNOSIS — M6281 Muscle weakness (generalized): Secondary | ICD-10-CM | POA: Diagnosis not present

## 2022-02-21 DIAGNOSIS — M25562 Pain in left knee: Secondary | ICD-10-CM | POA: Diagnosis not present

## 2022-02-21 DIAGNOSIS — M25462 Effusion, left knee: Secondary | ICD-10-CM | POA: Diagnosis not present

## 2022-02-21 DIAGNOSIS — M1712 Unilateral primary osteoarthritis, left knee: Secondary | ICD-10-CM | POA: Diagnosis not present

## 2022-02-26 DIAGNOSIS — M25461 Effusion, right knee: Secondary | ICD-10-CM | POA: Diagnosis not present

## 2022-02-26 DIAGNOSIS — M25561 Pain in right knee: Secondary | ICD-10-CM | POA: Diagnosis not present

## 2022-02-26 DIAGNOSIS — M6281 Muscle weakness (generalized): Secondary | ICD-10-CM | POA: Diagnosis not present

## 2022-02-26 DIAGNOSIS — M25661 Stiffness of right knee, not elsewhere classified: Secondary | ICD-10-CM | POA: Diagnosis not present

## 2022-03-02 DIAGNOSIS — M25562 Pain in left knee: Secondary | ICD-10-CM | POA: Diagnosis not present

## 2022-03-05 DIAGNOSIS — C73 Malignant neoplasm of thyroid gland: Secondary | ICD-10-CM | POA: Diagnosis not present

## 2022-03-05 DIAGNOSIS — Z79899 Other long term (current) drug therapy: Secondary | ICD-10-CM | POA: Diagnosis not present

## 2022-03-05 DIAGNOSIS — E890 Postprocedural hypothyroidism: Secondary | ICD-10-CM | POA: Diagnosis not present

## 2022-03-05 DIAGNOSIS — Z79890 Hormone replacement therapy (postmenopausal): Secondary | ICD-10-CM | POA: Diagnosis not present

## 2022-03-07 DIAGNOSIS — M6281 Muscle weakness (generalized): Secondary | ICD-10-CM | POA: Diagnosis not present

## 2022-03-07 DIAGNOSIS — M25461 Effusion, right knee: Secondary | ICD-10-CM | POA: Diagnosis not present

## 2022-03-07 DIAGNOSIS — M25661 Stiffness of right knee, not elsewhere classified: Secondary | ICD-10-CM | POA: Diagnosis not present

## 2022-03-07 DIAGNOSIS — M25561 Pain in right knee: Secondary | ICD-10-CM | POA: Diagnosis not present

## 2022-03-13 DIAGNOSIS — Z1231 Encounter for screening mammogram for malignant neoplasm of breast: Secondary | ICD-10-CM | POA: Diagnosis not present

## 2022-03-27 DIAGNOSIS — M6281 Muscle weakness (generalized): Secondary | ICD-10-CM | POA: Diagnosis not present

## 2022-03-27 DIAGNOSIS — M25461 Effusion, right knee: Secondary | ICD-10-CM | POA: Diagnosis not present

## 2022-03-27 DIAGNOSIS — M25661 Stiffness of right knee, not elsewhere classified: Secondary | ICD-10-CM | POA: Diagnosis not present

## 2022-03-27 DIAGNOSIS — M25561 Pain in right knee: Secondary | ICD-10-CM | POA: Diagnosis not present

## 2022-04-04 DIAGNOSIS — M25461 Effusion, right knee: Secondary | ICD-10-CM | POA: Diagnosis not present

## 2022-04-04 DIAGNOSIS — M6281 Muscle weakness (generalized): Secondary | ICD-10-CM | POA: Diagnosis not present

## 2022-04-04 DIAGNOSIS — M25661 Stiffness of right knee, not elsewhere classified: Secondary | ICD-10-CM | POA: Diagnosis not present

## 2022-04-04 DIAGNOSIS — M25561 Pain in right knee: Secondary | ICD-10-CM | POA: Diagnosis not present

## 2022-04-11 DIAGNOSIS — N6489 Other specified disorders of breast: Secondary | ICD-10-CM | POA: Diagnosis not present

## 2022-04-11 DIAGNOSIS — R922 Inconclusive mammogram: Secondary | ICD-10-CM | POA: Diagnosis not present

## 2022-04-16 DIAGNOSIS — M25661 Stiffness of right knee, not elsewhere classified: Secondary | ICD-10-CM | POA: Diagnosis not present

## 2022-04-16 DIAGNOSIS — M25461 Effusion, right knee: Secondary | ICD-10-CM | POA: Diagnosis not present

## 2022-04-16 DIAGNOSIS — M25561 Pain in right knee: Secondary | ICD-10-CM | POA: Diagnosis not present

## 2022-04-16 DIAGNOSIS — M6281 Muscle weakness (generalized): Secondary | ICD-10-CM | POA: Diagnosis not present

## 2022-04-19 DIAGNOSIS — M23352 Other meniscus derangements, posterior horn of lateral meniscus, left knee: Secondary | ICD-10-CM | POA: Diagnosis not present

## 2022-04-19 DIAGNOSIS — S83242A Other tear of medial meniscus, current injury, left knee, initial encounter: Secondary | ICD-10-CM | POA: Diagnosis not present

## 2022-04-19 DIAGNOSIS — M1712 Unilateral primary osteoarthritis, left knee: Secondary | ICD-10-CM | POA: Diagnosis not present

## 2022-04-19 DIAGNOSIS — M6752 Plica syndrome, left knee: Secondary | ICD-10-CM | POA: Diagnosis not present

## 2022-04-19 DIAGNOSIS — M23322 Other meniscus derangements, posterior horn of medial meniscus, left knee: Secondary | ICD-10-CM | POA: Diagnosis not present

## 2022-04-24 DIAGNOSIS — R2689 Other abnormalities of gait and mobility: Secondary | ICD-10-CM | POA: Diagnosis not present

## 2022-04-24 DIAGNOSIS — M25462 Effusion, left knee: Secondary | ICD-10-CM | POA: Diagnosis not present

## 2022-04-24 DIAGNOSIS — M25662 Stiffness of left knee, not elsewhere classified: Secondary | ICD-10-CM | POA: Diagnosis not present

## 2022-04-24 DIAGNOSIS — M25562 Pain in left knee: Secondary | ICD-10-CM | POA: Diagnosis not present

## 2022-05-01 DIAGNOSIS — M25462 Effusion, left knee: Secondary | ICD-10-CM | POA: Diagnosis not present

## 2022-05-01 DIAGNOSIS — M25562 Pain in left knee: Secondary | ICD-10-CM | POA: Diagnosis not present

## 2022-05-01 DIAGNOSIS — R2689 Other abnormalities of gait and mobility: Secondary | ICD-10-CM | POA: Diagnosis not present

## 2022-05-01 DIAGNOSIS — M25662 Stiffness of left knee, not elsewhere classified: Secondary | ICD-10-CM | POA: Diagnosis not present

## 2022-05-07 ENCOUNTER — Other Ambulatory Visit: Payer: Self-pay

## 2022-05-07 DIAGNOSIS — M25562 Pain in left knee: Secondary | ICD-10-CM | POA: Diagnosis not present

## 2022-05-07 DIAGNOSIS — M25462 Effusion, left knee: Secondary | ICD-10-CM | POA: Diagnosis not present

## 2022-05-07 DIAGNOSIS — R2689 Other abnormalities of gait and mobility: Secondary | ICD-10-CM | POA: Diagnosis not present

## 2022-05-07 DIAGNOSIS — M25662 Stiffness of left knee, not elsewhere classified: Secondary | ICD-10-CM | POA: Diagnosis not present

## 2022-05-07 MED ORDER — AMITRIPTYLINE HCL 25 MG PO TABS
25.0000 mg | ORAL_TABLET | Freq: Every day | ORAL | 1 refills | Status: AC
Start: 2022-05-07 — End: ?

## 2022-07-16 IMAGING — CT CT ABD-PELV W/ CM
2 of 5 series · 15 of 46 positions shown, 17 images · IV contrast (APPLIED)
Comparison: 09/08/2019 CT, 09/09/2019 MR and prior studies

CLINICAL DATA: 51-year-old female with acute abdominal pain and
vomiting for 1 day.

EXAM:
CT ABDOMEN AND PELVIS WITH CONTRAST
TECHNIQUE: Multidetector CT imaging of the abdomen and pelvis was performed
using the standard protocol following bolus administration of
intravenous contrast.

[Series 3: abdomen 5.0 · axial · 0.71mm/px · z∈[-1088,-718]mm · 12 of 88 slices shown, 14 images]
[im 7/88  soft-tissue]
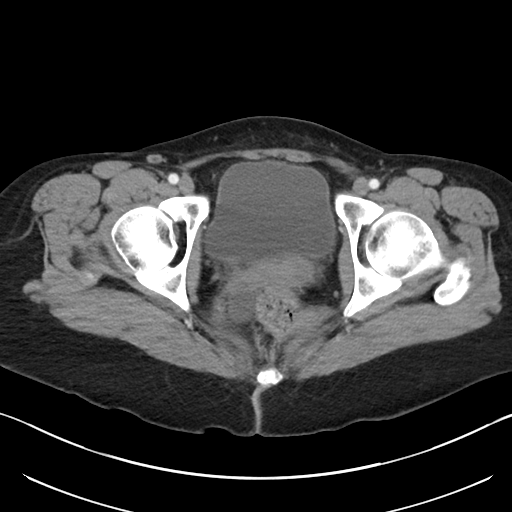
[im 7/88  bone]
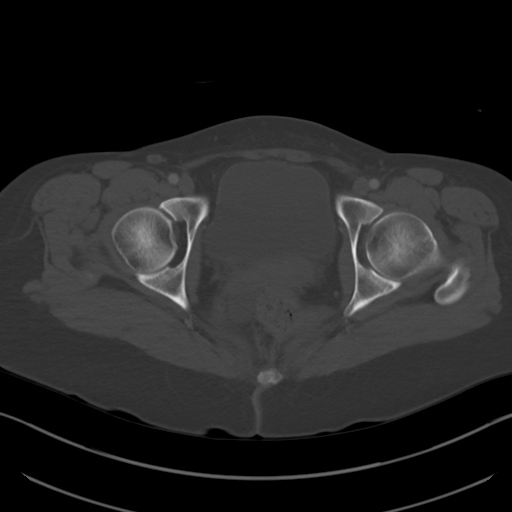
[im 14/88  soft-tissue]
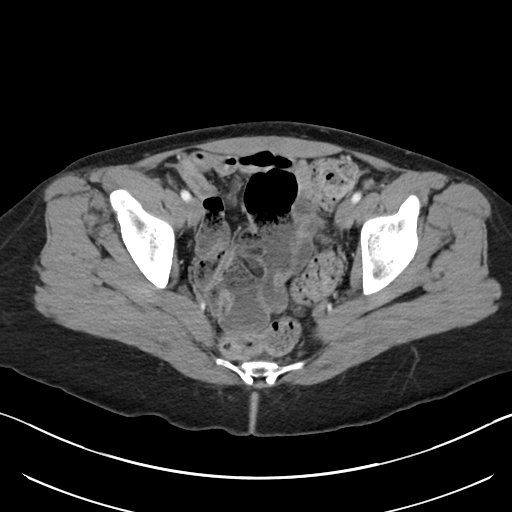
[im 21/88  soft-tissue]
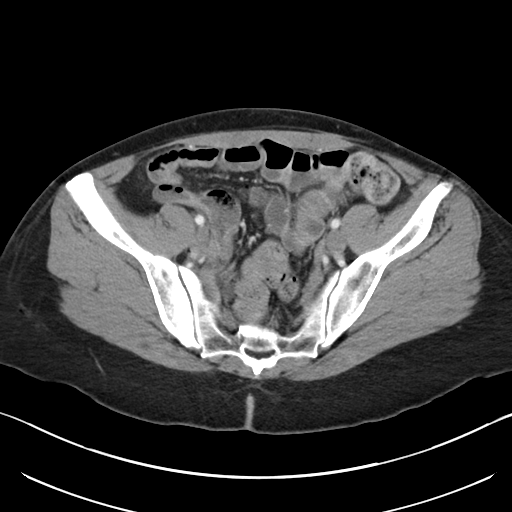
[im 27/88  soft-tissue]
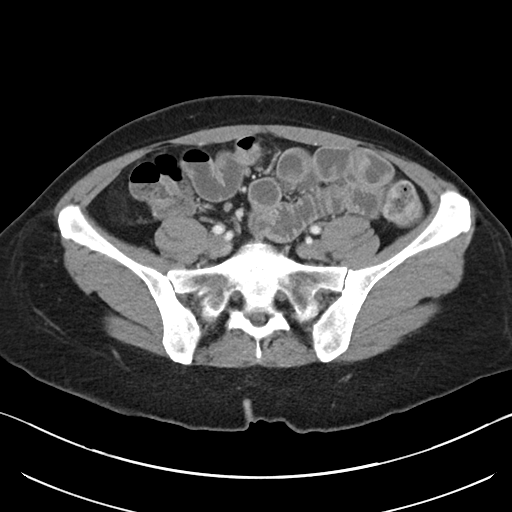
[im 34/88  soft-tissue]
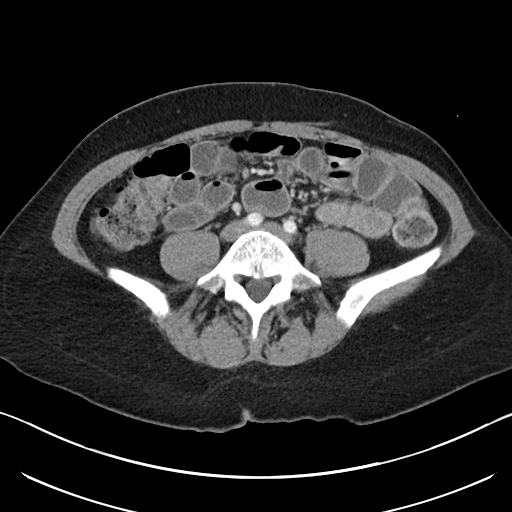
[im 41/88  soft-tissue]
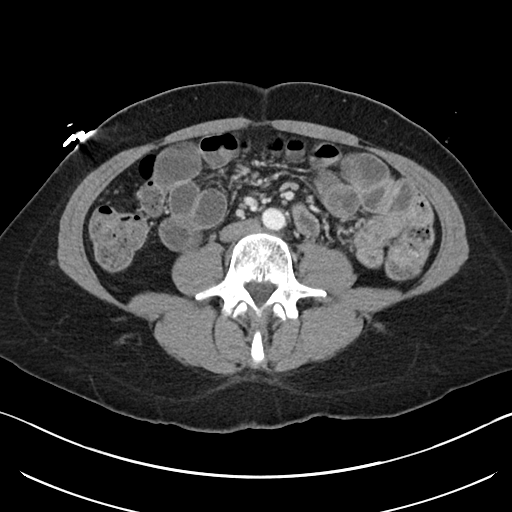
[im 47/88  soft-tissue]
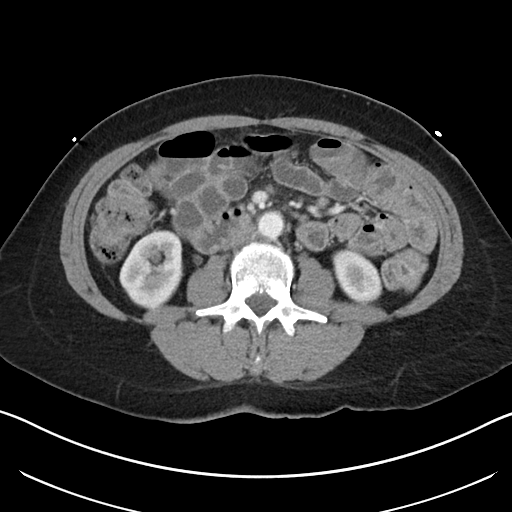
[im 54/88  soft-tissue]
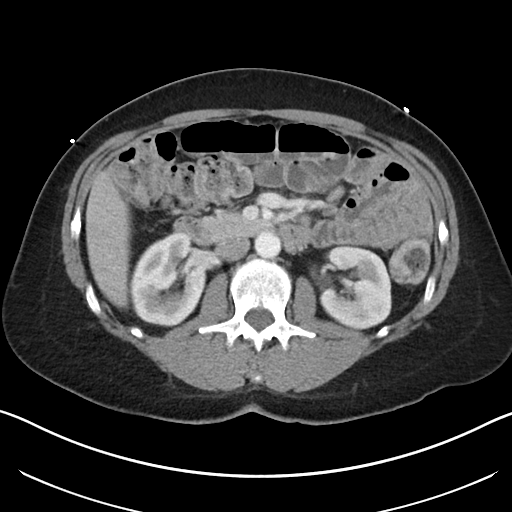
[im 61/88  soft-tissue]
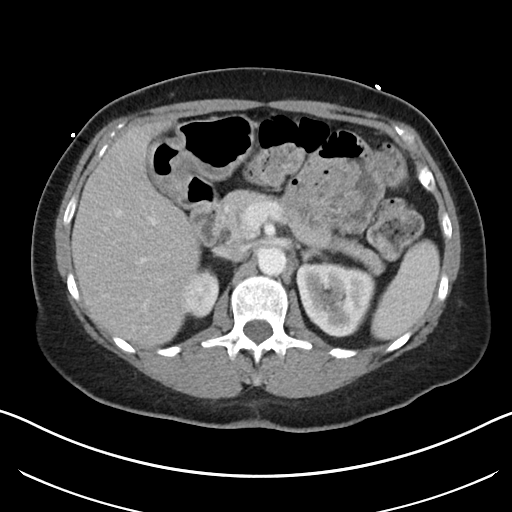
[im 61/88  bone]
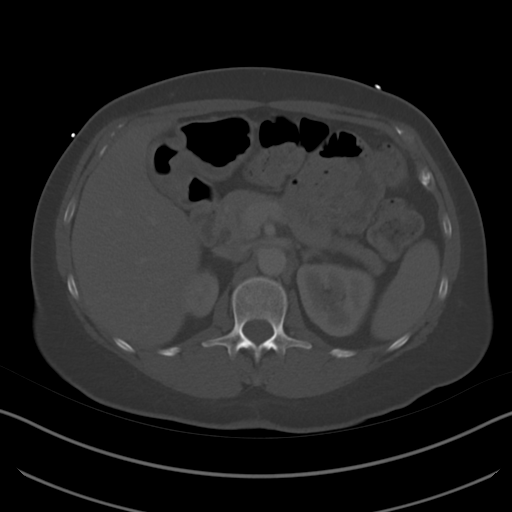
[im 67/88  soft-tissue]
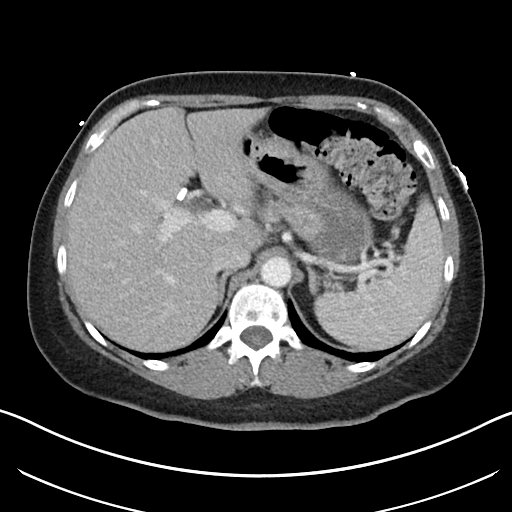
[im 74/88  soft-tissue]
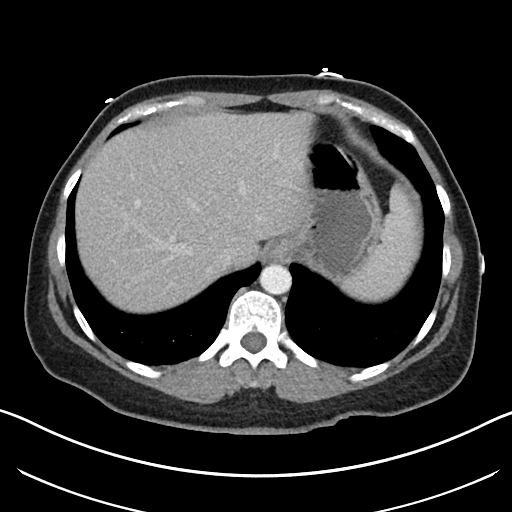
[im 81/88  soft-tissue]
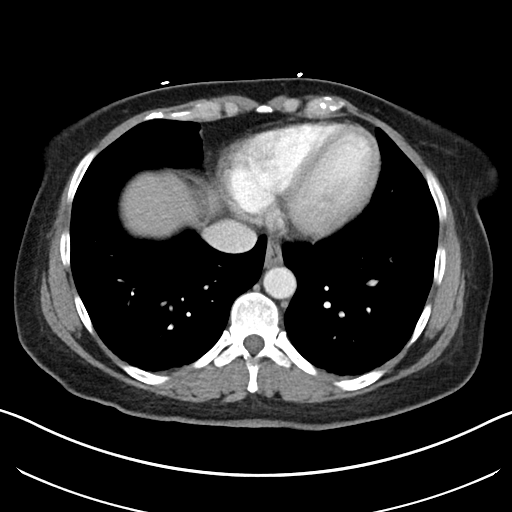

[Series 6: abdomen 3.0 mpr cor · coronal · 0.61mm/px · 3 of 88 slices shown]
[im 30/88  soft-tissue]
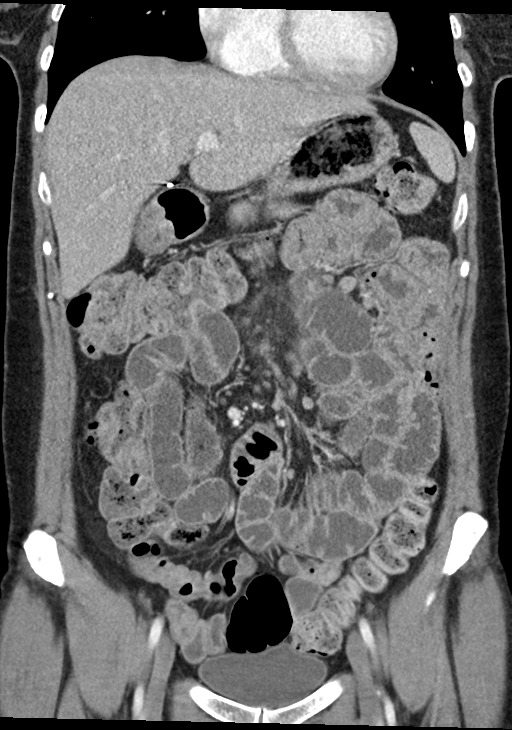
[im 39/88  soft-tissue]
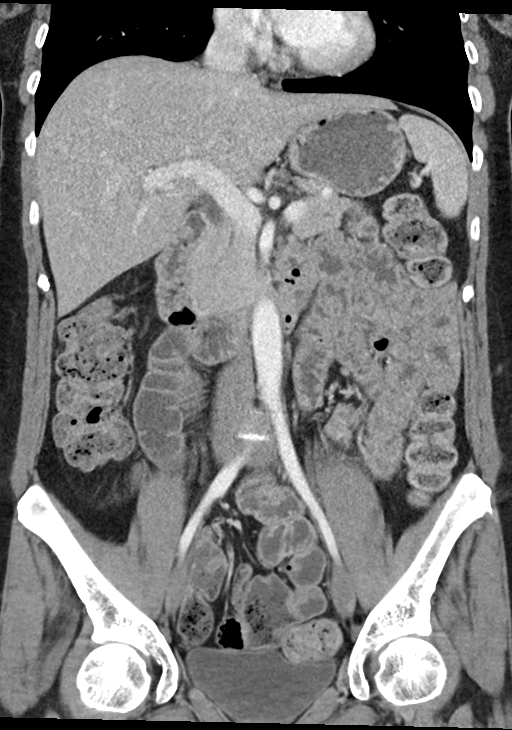
[im 49/88  soft-tissue]
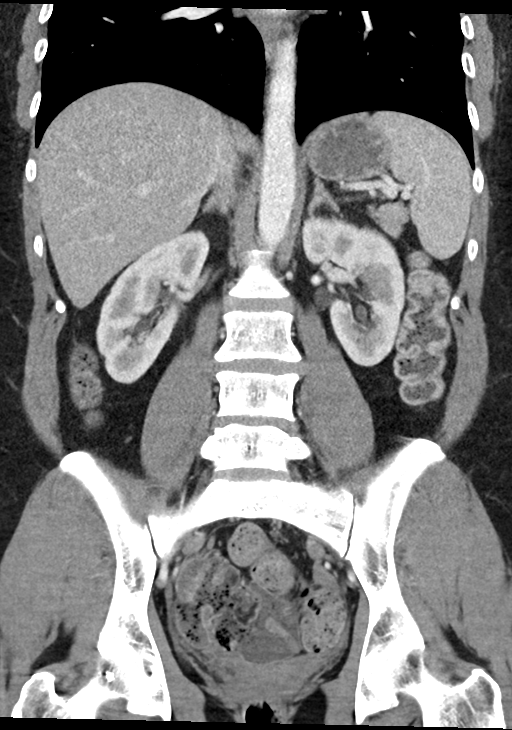

[15 of 46 positions shown; findings below may reference images not displayed]

RADIATION DOSE REDUCTION: This exam was performed according to the
departmental dose-optimization program which includes automated
exposure control, adjustment of the mA and/or kV according to
patient size and/or use of iterative reconstruction technique.

CONTRAST:  75mL OMNIPAQUE IOHEXOL 350 MG/ML SOLN
FINDINGS: Lower chest: No acute abnormality.

Hepatobiliary: The liver is unremarkable. The patient is status post
cholecystectomy. There is no evidence of intrahepatic or
extrahepatic biliary dilatation.

Pancreas: Unremarkable

Spleen: Unremarkable

Adrenals/Urinary Tract: The kidneys, adrenal glands and bladder are
unremarkable.

Stomach/Bowel: There are multiple small bowel loops filled with
fluid, UPPER limits of normal in caliber with equivocal mild wall
enhancement, which may represent an enteritis. There is no evidence
of bowel obstruction, other areas of bowel wall thickening or
inflammatory changes.

The appendix is unremarkable.

Vascular/Lymphatic: No significant vascular findings are present. No
enlarged abdominal or pelvic lymph nodes.

Reproductive: Status post hysterectomy. No adnexal masses.

Other: No ascites, focal collection or pneumoperitoneum.

Musculoskeletal: No acute or suspicious bony abnormalities are
noted.
IMPRESSION: 1. Multiple UPPER limits of normal caliber small bowel loops filled
with fluid with equivocal mild wall enhancement-possibly
representing an enteritis. No evidence of bowel obstruction or
pneumoperitoneum.
2. No other acute abnormalities.

## 2023-03-04 DIAGNOSIS — Z85850 Personal history of malignant neoplasm of thyroid: Secondary | ICD-10-CM | POA: Diagnosis not present

## 2023-03-04 DIAGNOSIS — Z08 Encounter for follow-up examination after completed treatment for malignant neoplasm: Secondary | ICD-10-CM | POA: Diagnosis not present

## 2023-03-04 DIAGNOSIS — Z79890 Hormone replacement therapy (postmenopausal): Secondary | ICD-10-CM | POA: Diagnosis not present

## 2023-03-04 DIAGNOSIS — E890 Postprocedural hypothyroidism: Secondary | ICD-10-CM | POA: Diagnosis not present

## 2023-03-04 DIAGNOSIS — C73 Malignant neoplasm of thyroid gland: Secondary | ICD-10-CM | POA: Diagnosis not present

## 2023-03-20 ENCOUNTER — Encounter: Payer: Self-pay | Admitting: Internal Medicine

## 2023-03-20 ENCOUNTER — Ambulatory Visit: Payer: BC Managed Care – PPO | Admitting: Internal Medicine

## 2023-03-20 VITALS — BP 122/98 | HR 94 | Temp 98.0°F | Resp 17 | Ht 68.0 in | Wt 198.0 lb

## 2023-03-20 DIAGNOSIS — Z85850 Personal history of malignant neoplasm of thyroid: Secondary | ICD-10-CM

## 2023-03-20 DIAGNOSIS — M19042 Primary osteoarthritis, left hand: Secondary | ICD-10-CM

## 2023-03-20 DIAGNOSIS — E66811 Obesity, class 1: Secondary | ICD-10-CM

## 2023-03-20 DIAGNOSIS — Z0000 Encounter for general adult medical examination without abnormal findings: Secondary | ICD-10-CM | POA: Insufficient documentation

## 2023-03-20 DIAGNOSIS — Z6830 Body mass index (BMI) 30.0-30.9, adult: Secondary | ICD-10-CM | POA: Diagnosis not present

## 2023-03-20 DIAGNOSIS — M19041 Primary osteoarthritis, right hand: Secondary | ICD-10-CM | POA: Insufficient documentation

## 2023-03-20 DIAGNOSIS — E782 Mixed hyperlipidemia: Secondary | ICD-10-CM | POA: Diagnosis not present

## 2023-03-20 DIAGNOSIS — E890 Postprocedural hypothyroidism: Secondary | ICD-10-CM | POA: Diagnosis not present

## 2023-03-20 DIAGNOSIS — F411 Generalized anxiety disorder: Secondary | ICD-10-CM

## 2023-03-20 DIAGNOSIS — Z1231 Encounter for screening mammogram for malignant neoplasm of breast: Secondary | ICD-10-CM

## 2023-03-20 DIAGNOSIS — G4700 Insomnia, unspecified: Secondary | ICD-10-CM

## 2023-03-20 NOTE — Assessment & Plan Note (Signed)
She can continue to use her xanax as needed.  She has no depression on screening today.

## 2023-03-20 NOTE — Assessment & Plan Note (Signed)
She had a thyroglobulin panel done which was normal.  This is in remission.

## 2023-03-20 NOTE — Progress Notes (Signed)
Office Visit  Subjective   Patient ID: Jordan Pollard   DOB: 1970-08-03   Age: 52 y.o.   MRN: [id number]   Chief Complaint Chief Complaint  Patient presents with   Annual Exam     History of Present Illness Jordan Pollard is a 52 year old Caucasian/White female who presents for her annual health maintenance exam. She is due for the following health maintenance studies: mammogram and screening labs. This patient's past medical history Fibroids/Leiomyoma of Uterus and Thyroid Cancer.   Her last eye exam was in 10/2018 and her eyes are doing well and there has been no problem with her vision. Her last digital screening mammogram was done on 03/13/2022 and showed an asymmetry in her right breast.  She underwent a right breast US and diagnostic mammogram on 04/11/2022 that was unremarkable and no evidence of malignancy.  There is no family history of colorectal cancer. She had a colonoscopy in 09/08/2021 by Dr. Charm Barges who found one tubular adenoma polyp which was removed. He wants to repeat her colonoscopy in 10 years. She had a hystrectomy in her 30's. The patient does exercise regularly by walking and lifting weights. The patient has never smoked.  She does get yearly flu vaccines. She has had 6 COVID-19 vaccines including 4 boosters. She denies any depression and no worsening anxiety. She gets Xanax as needed for panic attacks. There is no family history of heart disease.  The patient is not on an ASA.    The patient saw me in 10/2021 where she found nodule on the PIP joint of her left 5th finger.  She first noted this about a year ago but this lump at the joint has become larger and was painful.   It does hurt to grasp and she denies any weakness in her hands/fingers.  We did a rheumatological workup and this was negative.  I also obtained plain films of her fingers which were normal.  She states the pain has improved.  She states she has developed some arthritis at the base of her thumbs.    I did see [name] in 2022 due to complaints of insomnia.  This has been going on since college where she will go to sleep and wake up a few hours later and it takes a while for her to go back to sleep.  She is not having racing thoughts or thinks about activities during this awakened period.  She is not sure if she snores but she again has multiple awakenings at night and does not feel well rested in the morning.  The patient has tried to use Benadryl in the past which does help some.  She has been on melatonin which does not help.  When questioned about sleep hygiene, she denies blue screens before bed, denies caffeine before bed, she does not watch tv in bed and otherwise on other red flags for sleep hygiene.  I placed her on Elavil to be used as needed and she states this did help but she is no longer on this due to her insomnia being resolved.  She does have a history of generalized anxiety disorder.  She began having anxiety in her late 52's. Overall she states her anxiety is controlled but she has occasional episodes of panic attacks. She may have one panic attack about once per month.  She will take her Xanax which resolves these episodes. She reports no additional symptoms She denies difficulty concentrating, difficulty performing routine daily activities,  fatigue, extreme feelings of guilt, feelings of isolation, feelings of worthlessness, helpless feeling, weight loss, loss of appetite, social withdrawal, loss of interest in pleasurable activities, out of control feelings, and panic attacks. This patient feels that she is able to care for herself. She currently lives alone. She has no significant prior history of mental health disorders.  She has not tried her Xanax for sleep.  The patient is a 52 year old Caucasian/White female who returns for a regularly scheduled thyroid check. She ended up with thyroid cancer in 2016 with surgery and iodine ablation.  She saw her endocrinologist in 03/2023  where she remains on levothyroxine po daily except 1/2 tab po on Sundays.  She claims to have no symptoms suggestive of thyroid imbalance specifically denying fatigue, cold intolerance, heat intolerance, tremors, anxiety, unexplained weight changes, diarrhea, constipation, or dry skin.    She was admitted to [facility] in 09/2019 due to gallstone pancreatitis.  She had a MRCP which showed pancreatitis but no CBD stone.  They felt she had acute cholecystitis and she underwent a laparoscopic cholecystectomy with intraoperative cholangiogram.  She also had surgery on 02/02/2022 for outpatient medial meniscal repair of her right knee.  She had another meniscal repair of the left knee in 03/2022.  She has recovered from this.            Past Medical History Past Medical History:  Diagnosis Date   GAD (generalized anxiety disorder)    Insomnia    Papillary thyroid carcinoma (HCC) 2016   S/P thyroidectomy   Post-surgical hypothyroidism      Allergies No Known Allergies   Medications  Current Outpatient Medications:    acetaminophen (TYLENOL) 500 MG tablet, Take 1,000 mg by mouth daily as needed for mild pain or moderate pain., Disp: , Rfl:    ALPRAZolam (XANAX) 0.5 MG tablet, Take 0.5 mg by mouth 2 (two) times daily as needed for anxiety., Disp: , Rfl:    amitriptyline (ELAVIL) 25 MG tablet, Take 1 tablet (25 mg total) by mouth at bedtime., Disp: 180 tablet, Rfl: 1   calcium carbonate (OS-CAL) 600 MG TABS tablet, Take 1 tablet by mouth daily., Disp: , Rfl:    Cholecalciferol (VITAMIN D3) 125 MCG (5000 UT) CAPS, Take 1 capsule by mouth daily., Disp: , Rfl:    fluticasone (FLONASE) 50 MCG/ACT nasal spray, Place 1 spray into the nose at bedtime as needed for allergies., Disp: , Rfl:    ibuprofen (ADVIL) 200 MG tablet, Take 400-600 mg by mouth every 8 (eight) hours as needed for fever or moderate pain., Disp: , Rfl:    levothyroxine (SYNTHROID) 125 MCG tablet, Take 0.5-1 tablets by  mouth See admin instructions. Take 1 tablet a day except for Sunday, take 1/2 tablet., Disp: , Rfl:    MAGNESIUM PO, Take 1 tablet by mouth daily., Disp: , Rfl:    Multiple Vitamin (MULTIVITAMIN WITH MINERALS) TABS tablet, Take 1 tablet by mouth daily., Disp: , Rfl:    omega-3 fish oil (MAXEPA) 1000 MG CAPS capsule, Take 2 capsules by mouth daily., Disp: , Rfl:    Simethicone (GAS-X PO), Take 1 capsule by mouth as needed (gas relief)., Disp: , Rfl:    Review of Systems Review of Systems  Constitutional:  Negative for chills, fever and malaise/fatigue.  Eyes:  Negative for blurred vision and double vision.  Respiratory:  Negative for cough, hemoptysis, shortness of breath and wheezing.   Cardiovascular:  Negative for chest pain, palpitations and  leg swelling.  Gastrointestinal:  Negative for abdominal pain, blood in stool, constipation, diarrhea, heartburn, nausea and vomiting.  Genitourinary:  Negative for frequency and hematuria.  Musculoskeletal:  Negative for myalgias.  Skin:  Negative for itching and rash.  Neurological:  Negative for dizziness and weakness.  Endo/Heme/Allergies:  Negative for polydipsia.  Psychiatric/Behavioral:  Negative for depression, substance abuse and suicidal ideas. The patient has insomnia. The patient is not nervous/anxious.        Objective:    Vitals BP (!) 122/98   Pulse 94   Temp 98 F (36.7 C)   Resp 17   Ht 5\' 8"  (1.727 m)   Wt 198 lb (89.8 kg)   SpO2 98%   BMI 30.11 kg/m    Physical Examination Physical Exam Constitutional:      Appearance: Normal appearance. She is not ill-appearing.  HENT:     Head: Normocephalic and atraumatic.     Right Ear: Tympanic membrane, ear canal and external ear normal.     Left Ear: Tympanic membrane, ear canal and external ear normal.     Nose: Nose normal. No congestion or rhinorrhea.     Mouth/Throat:     Mouth: Mucous membranes are moist.     Pharynx: Oropharynx is clear. No posterior oropharyngeal  erythema.  Eyes:     General: No scleral icterus.    Conjunctiva/sclera: Conjunctivae normal.     Pupils: Pupils are equal, round, and reactive to light.  Neck:     Vascular: No carotid bruit.  Cardiovascular:     Rate and Rhythm: Normal rate and regular rhythm.     Pulses: Normal pulses.     Heart sounds: No murmur heard.    No friction rub. No gallop.  Pulmonary:     Effort: Pulmonary effort is normal. No respiratory distress.     Breath sounds: No wheezing, rhonchi or rales.  Abdominal:     General: Bowel sounds are normal. There is no distension.     Palpations: Abdomen is soft.     Tenderness: There is no abdominal tenderness.  Musculoskeletal:     Cervical back: Neck supple. No tenderness.     Right lower leg: No edema.     Left lower leg: No edema.  Lymphadenopathy:     Cervical: No cervical adenopathy.  Skin:    General: Skin is warm and dry.     Findings: No rash.  Neurological:     General: No focal deficit present.     Mental Status: She is alert and oriented to person, place, and time.  Psychiatric:        Mood and Affect: Mood normal.        Behavior: Behavior normal.        Assessment & Plan:   Mixed hyperlipidemia I reviewed her labs from [facility] on 03/04/2023.  She has hyperlipidemia but her HDL was 89.  I asked her to exercise and cut fats in her diet.  Hypothyroidism, postsurgical She did see [facility] on 11/4 and they checked her thyroid studies and she was doing well.  Primary osteoarthritis of both hands She can use tylenol as needed for any arthritic pain.  Annual physical exam Health maintenance discussed.  She needs an upcoming mammogram which we will arrange.  I reviewed her labs from [facility] on 03/04/2023.  BMI 30.0-30.9,adult I have asked her to continue to exercise, eat healthy and lose weight.  Class 1 obesity due to excess calories without serious comorbidity  with body mass index (BMI) of 30.0 to 30.9 in adult Plan as above.   GAD  (generalized anxiety disorder) She can continue to use her xanax as needed.  She has no depression on screening today.  History of thyroid cancer She had a thyroglobulin panel done which was normal.  This is in remission.  Insomnia Continue amitriptyline as needed.    Return in about 6 months (around 09/17/2023).   Crist Fat, MD

## 2023-03-20 NOTE — Assessment & Plan Note (Signed)
I have asked her to continue to exercise, eat healthy and lose weight.

## 2023-03-20 NOTE — Assessment & Plan Note (Signed)
Continue amitriptyline as needed

## 2023-03-20 NOTE — Assessment & Plan Note (Signed)
Health maintenance discussed.  She needs an upcoming mammogram which we will arrange.  I reviewed her labs from [facility] on 03/04/2023.

## 2023-03-20 NOTE — Assessment & Plan Note (Signed)
She can use tylenol as needed for any arthritic pain.

## 2023-03-20 NOTE — Assessment & Plan Note (Signed)
Plan as above.  

## 2023-03-20 NOTE — Assessment & Plan Note (Signed)
I reviewed her labs from [facility] on 03/04/2023.  She has hyperlipidemia but her HDL was 89.  I asked her to exercise and cut fats in her diet.

## 2023-03-20 NOTE — Assessment & Plan Note (Signed)
She did see [facility] on 11/4 and they checked her thyroid studies and she was doing well.

## 2023-03-25 DIAGNOSIS — Z1231 Encounter for screening mammogram for malignant neoplasm of breast: Secondary | ICD-10-CM | POA: Diagnosis not present

## 2023-04-17 DIAGNOSIS — Z1231 Encounter for screening mammogram for malignant neoplasm of breast: Secondary | ICD-10-CM | POA: Diagnosis not present

## 2023-05-15 ENCOUNTER — Encounter: Payer: Self-pay | Admitting: Internal Medicine

## 2023-05-15 ENCOUNTER — Ambulatory Visit: Payer: BC Managed Care – PPO | Admitting: Internal Medicine

## 2023-05-15 VITALS — BP 128/80 | HR 74 | Temp 98.6°F | Resp 18 | Ht 68.0 in | Wt 205.4 lb

## 2023-05-15 DIAGNOSIS — T148XXA Other injury of unspecified body region, initial encounter: Secondary | ICD-10-CM

## 2023-05-15 MED ORDER — CYCLOBENZAPRINE HCL 10 MG PO TABS: 10.0000 mg | ORAL_TABLET | Freq: Three times a day (TID) | ORAL | 0 refills | Status: DC | PRN

## 2023-05-15 MED ORDER — DICLOFENAC SODIUM 75 MG PO TBEC: 75.0000 mg | DELAYED_RELEASE_TABLET | Freq: Two times a day (BID) | ORAL | 0 refills | Status: AC

## 2023-05-15 NOTE — Progress Notes (Signed)
 Office Visit  Subjective   Patient ID: KATHYJO SOLES   DOB: 1970/05/03   Age: 53 y.o.   MRN: [id number]   Chief Complaint No chief complaint on file.    History of Present Illness Mrs. Wieringa comes comes in today in her lower back pain which started yesterday after she lifted her dog.  This pain is a sharp pain that worsens with getting up and down.  Her pain is rated as moderate.  She denies any new weakness/numbness or any loss of bowel/bladder function.  She has hurt her back in the past with muscle strain.  She has taken ibuprofen  which did help.     Past Medical History Past Medical History:  Diagnosis Date   GAD (generalized anxiety disorder)    Insomnia    Papillary thyroid  carcinoma (HCC) 2016   S/P thyroidectomy   Post-surgical hypothyroidism      Allergies No Known Allergies   Medications  Current Outpatient Medications:    acetaminophen  (TYLENOL ) 500 MG tablet, Take 1,000 mg by mouth daily as needed for mild pain or moderate pain., Disp: , Rfl:    ALPRAZolam (XANAX) 0.5 MG tablet, Take 0.5 mg by mouth 2 (two) times daily as needed for anxiety., Disp: , Rfl:    amitriptyline  (ELAVIL ) 25 MG tablet, Take 1 tablet (25 mg total) by mouth at bedtime., Disp: 180 tablet, Rfl: 1   calcium carbonate (OS-CAL) 600 MG TABS tablet, Take 1 tablet by mouth daily., Disp: , Rfl:    Cholecalciferol (VITAMIN D3) 125 MCG (5000 UT) CAPS, Take 1 capsule by mouth daily., Disp: , Rfl:    fluticasone (FLONASE) 50 MCG/ACT nasal spray, Place 1 spray into the nose at bedtime as needed for allergies., Disp: , Rfl:    ibuprofen  (ADVIL ) 200 MG tablet, Take 400-600 mg by mouth every 8 (eight) hours as needed for fever or moderate pain., Disp: , Rfl:    levothyroxine  (SYNTHROID ) 125 MCG tablet, Take 0.5-1 tablets by mouth See admin instructions. Take 1 tablet a day except for Sunday, take 1/2 tablet., Disp: , Rfl:    MAGNESIUM PO, Take 1 tablet by mouth daily., Disp: , Rfl:    Multiple  Vitamin (MULTIVITAMIN WITH MINERALS) TABS tablet, Take 1 tablet by mouth daily., Disp: , Rfl:    omega-3 fish oil (MAXEPA) 1000 MG CAPS capsule, Take 2 capsules by mouth daily., Disp: , Rfl:    Simethicone (GAS-X PO), Take 1 capsule by mouth as needed (gas relief)., Disp: , Rfl:    Review of Systems Review of Systems  Constitutional:  Negative for chills and fever.  Respiratory:  Negative for shortness of breath.   Cardiovascular:  Negative for chest pain and palpitations.  Musculoskeletal:  Positive for back pain. Negative for myalgias.  Neurological:  Negative for dizziness, tingling, weakness and headaches.       Objective:    Vitals BP 128/80   Pulse 74   Temp 98.6 F (37 C)   Resp 18   Ht 5\' 8"  (1.727 m)   Wt 205 lb 6.4 oz (93.2 kg)   SpO2 99%   BMI 31.23 kg/m    Physical Examination Physical Exam Constitutional:      Appearance: Normal appearance. She is not ill-appearing.  Cardiovascular:     Rate and Rhythm: Normal rate and regular rhythm.     Pulses: Normal pulses.     Heart sounds: No murmur heard.    No friction rub. No gallop.  Pulmonary:  Effort: Pulmonary effort is normal. No respiratory distress.     Breath sounds: No wheezing, rhonchi or rales.  Musculoskeletal:     Right lower leg: No edema.     Left lower leg: No edema.     Comments: She has pain over her lower lumbar spine bilaterally on palpation.  Skin:    General: Skin is warm and dry.     Findings: No rash.  Neurological:     Mental Status: She is alert.     Comments: Strength is 5/5        Assessment & Plan:   Muscle strain She has a pulled/strained muscle of her lower back.  We will start her on diclofenac  and a muscle relaxtant.  I want her to ice this 3-4 times per day over the next 2-3 days.    No follow-ups on file.   Wayne Haines, MD

## 2023-05-15 NOTE — Assessment & Plan Note (Signed)
 She has a pulled/strained muscle of her lower back.  We will start her on diclofenac  and a muscle relaxtant.  I want her to ice this 3-4 times per day over the next 2-3 days.

## 2023-05-27 ENCOUNTER — Encounter: Payer: Self-pay | Admitting: Internal Medicine

## 2023-07-05 ENCOUNTER — Encounter (HOSPITAL_COMMUNITY): Payer: Self-pay | Admitting: Emergency Medicine

## 2023-07-05 ENCOUNTER — Emergency Department (HOSPITAL_COMMUNITY)
Admission: EM | Admit: 2023-07-05 | Discharge: 2023-07-06 | Disposition: A | Discharge status: Home / Self Care | Attending: Emergency Medicine | Admitting: Emergency Medicine

## 2023-07-05 ENCOUNTER — Emergency Department (HOSPITAL_COMMUNITY)

## 2023-07-05 ENCOUNTER — Other Ambulatory Visit: Payer: Self-pay

## 2023-07-05 DIAGNOSIS — R509 Fever, unspecified: Secondary | ICD-10-CM | POA: Diagnosis not present

## 2023-07-05 DIAGNOSIS — R072 Precordial pain: Secondary | ICD-10-CM | POA: Diagnosis not present

## 2023-07-05 DIAGNOSIS — R5383 Other fatigue: Secondary | ICD-10-CM | POA: Diagnosis not present

## 2023-07-05 DIAGNOSIS — R059 Cough, unspecified: Secondary | ICD-10-CM | POA: Diagnosis not present

## 2023-07-05 DIAGNOSIS — R079 Chest pain, unspecified: Secondary | ICD-10-CM | POA: Diagnosis not present

## 2023-07-05 DIAGNOSIS — R0981 Nasal congestion: Secondary | ICD-10-CM | POA: Diagnosis not present

## 2023-07-05 LAB — CBC WITH DIFFERENTIAL/PLATELET
Abs Immature Granulocytes: 0.01 10*3/uL (ref 0.00–0.07)
Basophils Absolute: 0 10*3/uL (ref 0.0–0.1)
Basophils Relative: 1 %
Eosinophils Absolute: 0 10*3/uL (ref 0.0–0.5)
Eosinophils Relative: 0 %
HCT: 44.1 % (ref 36.0–46.0)
Hemoglobin: 15.1 g/dL — ABNORMAL HIGH (ref 12.0–15.0)
Immature Granulocytes: 0 %
Lymphocytes Relative: 19 %
Lymphs Abs: 0.7 10*3/uL (ref 0.7–4.0)
MCH: 31 pg (ref 26.0–34.0)
MCHC: 34.2 g/dL (ref 30.0–36.0)
MCV: 90.6 fL (ref 80.0–100.0)
Monocytes Absolute: 0.6 10*3/uL (ref 0.1–1.0)
Monocytes Relative: 16 %
Neutro Abs: 2.4 10*3/uL (ref 1.7–7.7)
Neutrophils Relative %: 64 %
Platelets: 217 10*3/uL (ref 150–400)
RBC: 4.87 MIL/uL (ref 3.87–5.11)
RDW: 13.5 % (ref 11.5–15.5)
WBC: 3.7 10*3/uL — ABNORMAL LOW (ref 4.0–10.5)
nRBC: 0 % (ref 0.0–0.2)

## 2023-07-05 LAB — COMPREHENSIVE METABOLIC PANEL
ALT: 79 U/L — ABNORMAL HIGH (ref 0–44)
AST: 60 U/L — ABNORMAL HIGH (ref 15–41)
Albumin: 3.7 g/dL (ref 3.5–5.0)
Alkaline Phosphatase: 80 U/L (ref 38–126)
Anion gap: 11 (ref 5–15)
BUN: 11 mg/dL (ref 6–20)
CO2: 20 mmol/L — ABNORMAL LOW (ref 22–32)
Calcium: 9 mg/dL (ref 8.9–10.3)
Chloride: 107 mmol/L (ref 98–111)
Creatinine, Ser: 0.78 mg/dL (ref 0.44–1.00)
GFR, Estimated: >60 mL/min (ref >60)
Glucose, Bld: 196 mg/dL — ABNORMAL HIGH (ref 70–99)
Potassium: 3.7 mmol/L (ref 3.5–5.1)
Sodium: 138 mmol/L (ref 135–145)
Total Bilirubin: 0.7 mg/dL (ref 0.0–1.2)
Total Protein: 6.9 g/dL (ref 6.5–8.1)

## 2023-07-05 LAB — LIPASE, BLOOD: Lipase: 35 U/L (ref 11–51)

## 2023-07-05 LAB — TROPONIN I (HIGH SENSITIVITY): Troponin I (High Sensitivity): 5 ng/L (ref <18)

## 2023-07-05 NOTE — ED Triage Notes (Signed)
 Pt in with chest pain since 7:30pm while lying on the couch. Pt has had cough and congestion since yesterday, went to UC and tested positive for flu today. Pt states cp is under bilateral breasts and radiates to back. +nausea and mild sob

## 2023-07-05 NOTE — ED Provider Triage Note (Signed)
 Emergency Medicine Provider Triage Evaluation Note  Jordan Pollard , a 53 y.o. female  was evaluated in triage.  Pt complains of chest pain.  Patient reports waking up from a nap around 7:30 PM with pain radiating to her ribs beneath her breast.  Pain varies from dull ache to sharp.  Pain is persistent but sometimes worsens with deep inspiration.  Was seen in urgent care earlier today and diagnosed with flu A.  Review of Systems  Positive: Chest pain, flu Negative:   Physical Exam  BP 128/62 (BP Location: Right Arm)   Pulse (!) 112   Temp 98.2 F (36.8 C)   Resp 18   Wt 90.7 kg   SpO2 97%   BMI 30.41 kg/m  Gen:   Awake, no distress   Resp:  Normal effort  MSK:   Moves extremities without difficulty  Other:    Medical Decision Making  Medically screening exam initiated at 9:00 PM.  Appropriate orders placed.  Jordan Pollard was informed that the remainder of the evaluation will be completed by another provider, this initial triage assessment does not replace that evaluation, and the importance of remaining in the ED until their evaluation is complete.   Maxwell Marion, PA-C 07/05/23 2101

## 2023-07-06 LAB — TROPONIN I (HIGH SENSITIVITY): Troponin I (High Sensitivity): 3 ng/L (ref <18)

## 2023-07-06 NOTE — ED Provider Notes (Signed)
 [facility] AT [facility] Provider Note   CSN: [id number] Arrival date & time: 07/05/23  2038     History  Chief Complaint  Patient presents with   Chest Pain   Influenza    Jordan Pollard is a 53 y.o. female.  The history is provided by the patient and a friend.  Patient with history of thyroid carcinoma s/p thyroidectomy, generalized anxiety disorder, presents with flulike illness and chest pain. Patient reports around March 6 she started having headache cough and bodyaches.  She reports generalized fatigue.  She also started running fevers up to 102.  She was seen at an outside urgent care on March 7 and diagnosed with influenza A, and was started on Tamiflu. Several hours ago she woke up with pain in her bilateral chest that radiates to her back.  She reports continued cough and bodyaches.  No hemoptysis.  No vomiting or diarrhea.  No recent travel.  She has no history of chronic lung disease.  She is a non-smoker.  She is now feeling improved. No previous history of CAD/VTE    Past Medical History:  Diagnosis Date   GAD (generalized anxiety disorder)    Insomnia    Papillary thyroid carcinoma (HCC) 2016   S/P thyroidectomy   Post-surgical hypothyroidism     Home Medications Prior to Admission medications   Medication Sig Start Date End Date Taking? Authorizing Provider  acetaminophen (TYLENOL) 500 MG tablet Take 1,000 mg by mouth daily as needed for mild pain or moderate pain.    [provider]  ALPRAZolam Prudy Feeler) 0.5 MG tablet Take 0.5 mg by mouth 2 (two) times daily as needed for anxiety. 04/20/19   [provider]  amitriptyline (ELAVIL) 25 MG tablet Take 1 tablet (25 mg total) by mouth at bedtime. 05/07/22   Crist Fat, MD  calcium carbonate (OS-CAL) 600 MG TABS tablet Take 1 tablet by mouth daily.    [provider]  Cholecalciferol (VITAMIN D3) 125 MCG (5000 UT) CAPS Take 1 capsule by mouth daily.     [provider]  fluticasone (FLONASE) 50 MCG/ACT nasal spray Place 1 spray into the nose at bedtime as needed for allergies.    [provider]  ibuprofen (ADVIL) 200 MG tablet Take 400-600 mg by mouth every 8 (eight) hours as needed for fever or moderate pain.    [provider]  levothyroxine (SYNTHROID) 125 MCG tablet Take 0.5-1 tablets by mouth See admin instructions. Take 1 tablet a day except for Sunday, take 1/2 tablet. 08/19/19   [provider]  MAGNESIUM PO Take 1 tablet by mouth daily.    [provider]  Multiple Vitamin (MULTIVITAMIN WITH MINERALS) TABS tablet Take 1 tablet by mouth daily.    [provider]  omega-3 fish oil (MAXEPA) 1000 MG CAPS capsule Take 2 capsules by mouth daily.    [provider]  Simethicone (GAS-X PO) Take 1 capsule by mouth as needed (gas relief).    [provider]      Allergies    Patient has no known allergies.    Review of Systems   Review of Systems  Constitutional:  Positive for fatigue and fever.  Musculoskeletal:  Positive for myalgias.  Neurological:  Positive for headaches.    Physical Exam Updated Vital Signs BP 115/68 (BP Location: Right Arm)   Pulse 71   Temp 98.4 F (36.9 C) (Oral)   Resp 18   Wt 90.7  kg   SpO2 98%   BMI 30.41 kg/m  Physical Exam CONSTITUTIONAL: Well developed/well nourished HEAD: Normocephalic/atraumatic EYES: EOMI/PERRL ENMT: no stridor or drooling NECK: supple no meningeal signs SPINE/BACK:entire spine nontender CV: S1/S2 noted, no murmurs/rubs/gallops noted LUNGS: Lungs are clear to auscultation bilaterally, no apparent distress Chest-point tenderness noted to the posterior ribs and anterior ribs, no crepitus ABDOMEN: soft, nontender, no rebound or guarding, bowel sounds noted throughout abdomen NEURO: Pt is awake/alert/appropriate, moves all extremitiesx4.  No facial droop.   EXTREMITIES: pulses normal/equal, full ROM, no  lower extremity edema or tenderness SKIN: warm, color normal PSYCH: no abnormalities of mood noted, alert and oriented to situation  ED Results / Procedures / Treatments   Labs (all labs ordered are listed, but only abnormal results are displayed) Labs Reviewed  COMPREHENSIVE METABOLIC PANEL - Abnormal; Notable for the following components:      Result Value   CO2 20 (*)    Glucose, Bld 196 (*)    AST 60 (*)    ALT 79 (*)    All other components within normal limits  CBC WITH DIFFERENTIAL/PLATELET - Abnormal; Notable for the following components:   WBC 3.7 (*)    Hemoglobin 15.1 (*)    All other components within normal limits  LIPASE, BLOOD  TROPONIN I (HIGH SENSITIVITY)  TROPONIN I (HIGH SENSITIVITY)    EKG EKG Interpretation Date/Time:  Saturday July 06 2023 00:22:43 EST Ventricular Rate:  66 PR Interval:  127 QRS Duration:  97 QT Interval:  421 QTC Calculation: 442 R Axis:   72  Text Interpretation: Sinus rhythm Normal ECG Confirmed by Zadie Rhine ([id number]) on 07/06/2023 12:25:18 AM  Radiology DG Chest Portable 1 View Result Date: 07/05/2023 CLINICAL DATA:  Chest pain. EXAM: PORTABLE CHEST 1 VIEW COMPARISON:  None Available. FINDINGS: The cardiomediastinal contours are normal. The lungs are clear. Pulmonary vasculature is normal. No consolidation, pleural effusion, or pneumothorax. No acute osseous abnormalities are seen. IMPRESSION: No active disease. Electronically Signed   By: Narda Rutherford M.D.   On: 07/05/2023 23:43    Procedures Procedures    Medications Ordered in ED Medications - No data to display  ED Course/ Medical Decision Making/ A&P             HEART Score: 2                    Medical Decision Making Amount and/or Complexity of Data Reviewed ECG/medicine tests: ordered.   This patient presents to the ED for concern of chest pain, this involves an extensive number of treatment options, and is a complaint that carries with it a high risk  of complications and morbidity.  The differential diagnosis includes but is not limited to acute coronary syndrome, aortic dissection, pulmonary embolism, pericarditis, pneumothorax, pneumonia, myocarditis, pleurisy, esophageal rupture   Comorbidities that complicate the patient evaluation: Patient's presentation is complicated by their history of anxiety disorder   Additional history obtained: Additional history obtained from  friend Records reviewed  previous records reviewed  Lab Tests: I Ordered, and personally interpreted labs.  The pertinent results include: Dehydration, mild hyperglycemia, mild transaminitis  Imaging Studies ordered: I ordered imaging studies including X-ray chest   I independently visualized and interpreted imaging which showed no acute findings I agree with the radiologist interpretation   Test Considered: Patient is low risk / negative by heart score, therefore do not feel that cardiac admission is indicated. Low suspicion for dissection or PE or pericarditis  at this time given history and exam  Reevaluation: After the interventions noted above, I reevaluated the patient and found that they have :improved  Complexity of problems addressed: Patient's presentation is most consistent with  acute presentation with potential threat to life or bodily function  Disposition: After consideration of the diagnostic results and the patient's response to treatment,  I feel that the patent would benefit from discharge   .   Overall patient is very well-appearing.  Recent diagnosed with influenza now having bodyaches and had bilateral chest pain that is reproducible.  Her vital signs have improved.  Rate related changes on initial EKG is now improved as of EKG at 12:22 AM Patient safe for discharge home        Final Clinical Impression(s) / ED Diagnoses Final diagnoses:  Precordial pain    Rx / DC Orders ED Discharge Orders     None          Zadie Rhine, MD 07/06/23 [id number]

## 2023-07-06 NOTE — Discharge Instructions (Addendum)

## 2023-10-30 ENCOUNTER — Encounter: Payer: Self-pay | Admitting: Internal Medicine

## 2023-10-30 ENCOUNTER — Ambulatory Visit: Admitting: Internal Medicine

## 2023-10-30 VITALS — BP 112/82 | HR 64 | Temp 97.8°F | Resp 18 | Ht 68.0 in | Wt 207.2 lb

## 2023-10-30 DIAGNOSIS — M25522 Pain in left elbow: Secondary | ICD-10-CM | POA: Diagnosis not present

## 2023-10-30 DIAGNOSIS — M25521 Pain in right elbow: Secondary | ICD-10-CM | POA: Diagnosis not present

## 2023-10-30 DIAGNOSIS — M19021 Primary osteoarthritis, right elbow: Secondary | ICD-10-CM | POA: Diagnosis not present

## 2023-10-30 MED ORDER — METHYLPREDNISOLONE 4 MG PO TBPK: ORAL_TABLET | ORAL | 0 refills | Status: AC

## 2023-10-30 NOTE — Assessment & Plan Note (Signed)
 She has pain where her ulnar nerve crosses the medial epicondyle.  However she has bilateral lateral and medial epicondytlitis on exam.  I am going to obtain xrays of her elbows.  I gave her a handout on lateral and medial epicondylitis.  I want her to ice this area as directed.  We will give her a medrol dose pak.  If she is not improved in the next 3-4 weeks, we can refer to ortho.

## 2023-10-30 NOTE — Progress Notes (Signed)
 Office Visit  Subjective   Patient ID: Jordan Pollard   DOB: [birth year]   Age: 53 y.o.   MRN: [id number]   Chief Complaint Chief Complaint  Patient presents with   Acute Visit    Pt in today for elbow pain. The patient reports for years both of her elbows have been hurting, states her left elbow has been hurting more than the right over the past month.      History of Present Illness Jordan Pollard is a 53 yo female who comes in for an acute visit due to bilateral elbow pain.  She has had bilateral elbow pain for the last 1-2 years with left worse than right.  However over the past 1-2 months, her pain in her left elbow has worsened where she has severe pain.  Her pain is described as intermittent dull aching pain that can be sharp pain depending on how she moves her arm.  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  The patient will use either tylenol  or ibuprofen  as needed and sometimes this does help.   She denies having trauma or injuries to her arms or elbows.       Past Medical History Past Medical History:  Diagnosis Date   GAD (generalized anxiety disorder)    Insomnia    Papillary thyroid  carcinoma (HCC) 2016   S/P thyroidectomy   Post-surgical hypothyroidism      Allergies No Known Allergies   Medications  Current Outpatient Medications:    acetaminophen  (TYLENOL ) 500 MG tablet, Take 1,000 mg by mouth daily as needed for mild pain or moderate pain., Disp: , Rfl:    ALPRAZolam (XANAX) 0.5 MG tablet, Take 0.5 mg by mouth 2 (two) times daily as needed for anxiety., Disp: , Rfl:    amitriptyline  (ELAVIL ) 25 MG tablet, Take 1 tablet (25 mg total) by mouth at bedtime., Disp: 180 tablet, Rfl: 1   calcium carbonate (OS-CAL) 600 MG TABS tablet, Take 1 tablet by mouth daily., Disp: , Rfl:    Cholecalciferol (VITAMIN D3) 125 MCG (5000 UT) CAPS, Take 1 capsule by mouth daily., Disp: , Rfl:    fluticasone (FLONASE) 50 MCG/ACT nasal  spray, Place 1 spray into the nose at bedtime as needed for allergies., Disp: , Rfl:    ibuprofen  (ADVIL ) 200 MG tablet, Take 400-600 mg by mouth every 8 (eight) hours as needed for fever or moderate pain., Disp: , Rfl:    levothyroxine  (SYNTHROID ) 125 MCG tablet, Take 0.5-1 tablets by mouth See admin instructions. Take 1 tablet a day except for Sunday, take 1/2 tablet., Disp: , Rfl:    MAGNESIUM PO, Take 1 tablet by mouth daily., Disp: , Rfl:    Multiple Vitamin (MULTIVITAMIN WITH MINERALS) TABS tablet, Take 1 tablet by mouth daily., Disp: , Rfl:    omega-3 fish oil (MAXEPA) 1000 MG CAPS capsule, Take 2 capsules by mouth daily., Disp: , Rfl:    Simethicone (GAS-X PO), Take 1 capsule by mouth as needed (gas relief)., Disp: , Rfl:    Review of Systems Review of Systems  Constitutional:  Negative for chills and fever.  Respiratory:  Negative for shortness of breath.   Cardiovascular:  Negative for chest pain.  Gastrointestinal:  Negative for abdominal pain, constipation, diarrhea, nausea and vomiting.  Musculoskeletal:  Negative for back pain, falls, myalgias and neck pain.  Neurological:  Negative for dizziness, weakness and headaches.       Objective:  Vitals BP 112/82   Pulse 64   Temp 97.8 F (36.6 C) (Temporal)   Resp 18   Ht 5' 8 (1.727 m)   Wt 207 lb 3.2 oz (94 kg)   SpO2 98%   BMI 31.50 kg/m    Physical Examination Physical Exam Constitutional:      Appearance: Normal appearance. She is not ill-appearing.  Cardiovascular:     Rate and Rhythm: Normal rate and regular rhythm.     Pulses: Normal pulses.     Heart sounds: No murmur heard.    No friction rub. No gallop.  Pulmonary:     Effort: Pulmonary effort is normal. No respiratory distress.     Breath sounds: No wheezing, rhonchi or rales.  Abdominal:     General: Bowel sounds are normal. There is no distension.     Palpations: Abdomen is soft.     Tenderness: There is no abdominal tenderness.   Musculoskeletal:     Right lower leg: No edema.     Left lower leg: No edema.     Comments: ROM of elbows bilaterally are normal.  She does have point tenderness behind the medial epicondyle where her ulnar nerve transverse.  She does does have point tenderness over the tendons bilaterally of her lateral and medial epicondyle.  Skin:    General: Skin is warm and dry.     Findings: No rash.  Neurological:     Mental Status: She is alert.     Comments: Her strength in her hand are normal.        Assessment & Plan:   Pain of both elbows She has pain where her ulnar nerve crosses the medial epicondyle.  However she has bilateral lateral and medial epicondytlitis on exam.  I am going to obtain xrays of her elbows.  I gave her a handout on lateral and medial epicondylitis.  I want her to ice this area as directed.  We will give her a medrol dose pak.  If she is not improved in the next 3-4 weeks, we can refer to ortho.    No follow-ups on file.   Selinda Fleeta Finger, MD

## 2023-11-04 ENCOUNTER — Encounter: Payer: Self-pay | Admitting: Internal Medicine

## 2024-03-09 DIAGNOSIS — E890 Postprocedural hypothyroidism: Secondary | ICD-10-CM | POA: Diagnosis not present

## 2024-03-09 DIAGNOSIS — Z9089 Acquired absence of other organs: Secondary | ICD-10-CM | POA: Diagnosis not present

## 2024-03-09 DIAGNOSIS — C73 Malignant neoplasm of thyroid gland: Secondary | ICD-10-CM | POA: Diagnosis not present

## 2024-03-16 ENCOUNTER — Telehealth (HOSPITAL_BASED_OUTPATIENT_CLINIC_OR_DEPARTMENT_OTHER): Payer: Self-pay | Admitting: Family Medicine

## 2024-03-16 NOTE — Telephone Encounter (Signed)
 Copied from CRM #[id number]. Topic: Appointments - Transfer of Care >> Mar 13, 2024  4:18 PM Amy B wrote: Pt is requesting to transfer FROM: Jordan Pollard with [facility] Pt is requesting to transfer TO: [facility] Reason for requested transfer: provider leaving practice It is the responsibility of the team the patient would like to transfer to (Dr. Norleen Fearing) to reach out to the patient if for any reason this transfer is not acceptable.    For your review, patient is already scheduled 11/25. Please let me know if you do not accept TOC

## 2024-03-24 ENCOUNTER — Ambulatory Visit (INDEPENDENT_AMBULATORY_CARE_PROVIDER_SITE_OTHER): Admitting: Family Medicine

## 2024-03-24 ENCOUNTER — Encounter: Admitting: Internal Medicine

## 2024-03-24 ENCOUNTER — Encounter (HOSPITAL_BASED_OUTPATIENT_CLINIC_OR_DEPARTMENT_OTHER): Payer: Self-pay | Admitting: Family Medicine

## 2024-03-24 VITALS — BP 122/86 | HR 68 | Temp 98.4°F | Resp 16 | Ht 68.5 in | Wt 203.0 lb

## 2024-03-24 DIAGNOSIS — Z1231 Encounter for screening mammogram for malignant neoplasm of breast: Secondary | ICD-10-CM | POA: Diagnosis not present

## 2024-03-24 DIAGNOSIS — Z0000 Encounter for general adult medical examination without abnormal findings: Secondary | ICD-10-CM

## 2024-03-24 NOTE — Assessment & Plan Note (Addendum)
 Overall doing well, but we had an extended discussion about weight loss.  Healthy diet and exercise discussed in some detail.  Promptly request a copy of her last Pap smear.  Await labs and f/u with her soon.  Vaccines reviewed and she declines any further vaccines for now.  I encouraged her to resume some therapy, and advised her that I'm only agreeable to infrequent rxs for the Alprazolam.  She is a bit resistant to an SSRI or similar agent at this time.

## 2024-03-24 NOTE — Progress Notes (Signed)
 New Patient Office Visit  Subjective    Patient ID: Jordan Pollard, female    DOB: 08/02/1970  Age: 53 y.o. MRN: [id number]  CC:  Chief Complaint  Patient presents with   Establish Care    Establish Care     HPI [name] presents to establish care F/u as above.  New to my practice.  Requests a Wellness exam today.  Generally doing well.  She has not tried previous daily anxiolytics in the past.  She only uses her Alprazolam infrequently and understands the risks involved.  Last Pap with about 3-4 years ago with Dr. Gaccione, and she states she isn't yet due for a repeat.  Outpatient Encounter Medications as of 03/24/2024  Medication Sig   ALPRAZolam (XANAX) 0.5 MG tablet Take 0.5 mg by mouth 2 (two) times daily as needed for anxiety.   amitriptyline  (ELAVIL ) 25 MG tablet Take 1 tablet (25 mg total) by mouth at bedtime.   calcium carbonate (OS-CAL) 600 MG TABS tablet Take 1 tablet by mouth daily.   Cholecalciferol (VITAMIN D3) 125 MCG (5000 UT) CAPS Take 1 capsule by mouth daily.   fluticasone (FLONASE) 50 MCG/ACT nasal spray Place 1 spray into the nose at bedtime as needed for allergies.   ibuprofen  (ADVIL ) 200 MG tablet Take 400-600 mg by mouth every 8 (eight) hours as needed for fever or moderate pain.   levothyroxine  (SYNTHROID ) 125 MCG tablet Take 0.5-1 tablets by mouth See admin instructions. Take 1 tablet a day except for Sunday, take 1/2 tablet.   MAGNESIUM PO Take 1 tablet by mouth daily.   omega-3 fish oil (MAXEPA) 1000 MG CAPS capsule Take 2 capsules by mouth daily.   [DISCONTINUED] Multiple Vitamin (MULTIVITAMIN WITH MINERALS) TABS tablet Take 1 tablet by mouth daily.   [DISCONTINUED] Simethicone (GAS-X PO) Take 1 capsule by mouth as needed (gas relief).   [DISCONTINUED] triamcinolone  (NASACORT) 55 MCG/ACT AERO nasal inhaler Place 2 sprays into the nose.   acetaminophen  (TYLENOL ) 500 MG tablet Take 1,000 mg by mouth daily as needed for mild pain or moderate  pain. (Patient not taking: Reported on 03/24/2024)   methylPREDNISolone  (MEDROL  DOSEPAK) 4 MG TBPK tablet Use as directed. (Patient not taking: Reported on 03/24/2024)   No facility-administered encounter medications on file as of 03/24/2024.    Past Medical History:  Diagnosis Date   GAD (generalized anxiety disorder)    History of thyroid  cancer    f/by [facility]   Insomnia    Obesity    Post-surgical hypothyroidism     Past Surgical History:  Procedure Laterality Date   ABDOMINAL HYSTERECTOMY N/A    for noncancerous reasons.  Intact cervix with 2021 Normal Pap.  Ovaries intact.   CHOLECYSTECTOMY N/A 09/10/2019   Procedure: LAPAROSCOPIC CHOLECYSTECTOMY WITH INTRAOPERATIVE CHOLANGIOGRAM;  Surgeon: Kinsinger, Herlene Righter, MD;  Location: [facility] OR;  Service: General;  Laterality: N/A;   COLONOSCOPY N/A    circa 2022 with Dr. Towana   THYROIDECTOMY N/A     Family History  Problem Relation Age of Onset   Glaucoma Mother    Diabetes Mother    Hypertension Mother    Hyperlipidemia Mother    Alzheimer's disease Father    Colon cancer Paternal Grandfather     Social History   Tobacco Use   Smoking status: Never   Smokeless tobacco: Never  Vaping Use   Vaping status: Never Used  Substance Use Topics   Alcohol use: Not Currently    Comment: Historically  light   Drug use: Not Currently    Review of Systems  Constitutional: Negative.   HENT: Negative.    Eyes: Negative.   Respiratory: Negative.    Cardiovascular: Negative.   Gastrointestinal: Negative.   Genitourinary: Negative.   Musculoskeletal: Negative.   Skin: Negative.   Neurological: Negative.   Endo/Heme/Allergies: Negative.   Psychiatric/Behavioral:  The patient is nervous/anxious.         Objective    BP 122/86 (Cuff Size: Large)   Pulse 68   Temp 98.4 F (36.9 C) (Oral)   Resp 16   Ht 5' 8.5 (1.74 m)   Wt 203 lb (92.1 kg)   SpO2 99%   BMI 30.41 kg/m   Physical Exam Constitutional:       General: She is not in acute distress.    Appearance: Normal appearance. She is obese. She is not ill-appearing.  HENT:     Head:     Comments: Advised eye doctor and dentist    Right Ear: Tympanic membrane normal.     Left Ear: Tympanic membrane normal.     Nose: Nose normal.     Mouth/Throat:     Pharynx: Oropharynx is clear.  Eyes:     Extraocular Movements: Extraocular movements intact.     Conjunctiva/sclera: Conjunctivae normal.     Pupils: Pupils are equal, round, and reactive to light.  Neck:     Vascular: No carotid bruit.  Cardiovascular:     Rate and Rhythm: Normal rate and regular rhythm.     Pulses: Normal pulses.     Heart sounds: Normal heart sounds.  Pulmonary:     Breath sounds: Normal breath sounds.  Abdominal:     General: Bowel sounds are normal. There is no distension.     Palpations: Abdomen is soft. There is no mass.     Tenderness: There is no abdominal tenderness.     Hernia: No hernia is present.  Musculoskeletal:        General: No tenderness. Normal range of motion.     Cervical back: Normal range of motion and neck supple. No tenderness.     Right lower leg: No edema.     Left lower leg: No edema.  Lymphadenopathy:     Cervical: No cervical adenopathy.  Skin:    General: Skin is warm and dry.     Coloration: Skin is not jaundiced.     Findings: No lesion.  Neurological:     General: No focal deficit present.     Mental Status: She is alert.  Psychiatric:        Mood and Affect: Mood normal.         Assessment & Plan:  Wellness examination Assessment & Plan: Overall doing well, but we had an extended discussion about weight loss.  Healthy diet and exercise discussed in some detail.  Promptly request a copy of her last Pap smear.  Await labs and f/u with her soon.  Vaccines reviewed and she declines any further vaccines for now.  I encouraged her to resume some therapy, and advised her that I'm only agreeable to infrequent rxs for the  Alprazolam.  She is a bit resistant to an SSRI or similar agent at this time.  Orders: -     CBC with Differential/Platelet -     Comprehensive metabolic panel with GFR -     Lipid panel  Encounter for screening mammogram for malignant neoplasm of breast -  3D Screening Mammogram, Left and Right; Future    No follow-ups on file.   REDDING PONCE NORLEEN FALCON., MD

## 2024-03-25 ENCOUNTER — Ambulatory Visit (HOSPITAL_BASED_OUTPATIENT_CLINIC_OR_DEPARTMENT_OTHER): Payer: Self-pay | Admitting: Family Medicine

## 2024-03-25 LAB — LIPID PANEL
Chol/HDL Ratio: 2.5 ratio (ref 0.0–4.4)
Cholesterol, Total: 244 mg/dL — ABNORMAL HIGH (ref 100–199)
HDL: 97 mg/dL (ref >39)
LDL Chol Calc (NIH): 137 mg/dL — ABNORMAL HIGH (ref 0–99)
Triglycerides: 63 mg/dL (ref 0–149)
VLDL Cholesterol Cal: 10 mg/dL (ref 5–40)

## 2024-03-25 LAB — COMPREHENSIVE METABOLIC PANEL WITH GFR
ALT: 51 IU/L — ABNORMAL HIGH (ref 0–32)
AST: 37 IU/L (ref 0–40)
Albumin: 4.8 g/dL (ref 3.8–4.9)
Alkaline Phosphatase: 110 IU/L (ref 49–135)
BUN/Creatinine Ratio: 19 (ref 9–23)
BUN: 16 mg/dL (ref 6–24)
Bilirubin Total: 0.4 mg/dL (ref 0.0–1.2)
CO2: 22 mmol/L (ref 20–29)
Calcium: 10 mg/dL (ref 8.7–10.2)
Chloride: 100 mmol/L (ref 96–106)
Creatinine, Ser: 0.83 mg/dL (ref 0.57–1.00)
Globulin, Total: 2.9 g/dL (ref 1.5–4.5)
Glucose: 92 mg/dL (ref 70–99)
Potassium: 4 mmol/L (ref 3.5–5.2)
Sodium: 140 mmol/L (ref 134–144)
Total Protein: 7.7 g/dL (ref 6.0–8.5)
eGFR: 84 mL/min/1.73 (ref >59)

## 2024-03-25 LAB — CBC WITH DIFFERENTIAL/PLATELET
Basophils Absolute: 0.1 x10E3/uL (ref 0.0–0.2)
Basos: 1 %
EOS (ABSOLUTE): 0.1 x10E3/uL (ref 0.0–0.4)
Eos: 1 %
Hematocrit: 47.1 % — ABNORMAL HIGH (ref 34.0–46.6)
Hemoglobin: 15.5 g/dL (ref 11.1–15.9)
Immature Grans (Abs): 0 x10E3/uL (ref 0.0–0.1)
Immature Granulocytes: 0 %
Lymphocytes Absolute: 1.6 x10E3/uL (ref 0.7–3.1)
Lymphs: 30 %
MCH: 31 pg (ref 26.6–33.0)
MCHC: 32.9 g/dL (ref 31.5–35.7)
MCV: 94 fL (ref 79–97)
Monocytes Absolute: 0.3 x10E3/uL (ref 0.1–0.9)
Monocytes: 6 %
Neutrophils Absolute: 3.4 x10E3/uL (ref 1.4–7.0)
Neutrophils: 62 %
Platelets: 249 x10E3/uL (ref 150–450)
RBC: 5 x10E6/uL (ref 3.77–5.28)
RDW: 12.1 % (ref 11.7–15.4)
WBC: 5.5 x10E3/uL (ref 3.4–10.8)

## 2024-04-07 ENCOUNTER — Inpatient Hospital Stay (INDEPENDENT_AMBULATORY_CARE_PROVIDER_SITE_OTHER): Admission: RE | Admit: 2024-04-07 | Discharge: 2024-04-07 | Attending: Family Medicine | Admitting: Family Medicine

## 2024-04-07 ENCOUNTER — Encounter (HOSPITAL_BASED_OUTPATIENT_CLINIC_OR_DEPARTMENT_OTHER): Payer: Self-pay | Admitting: Radiology

## 2024-04-07 DIAGNOSIS — Z1231 Encounter for screening mammogram for malignant neoplasm of breast: Secondary | ICD-10-CM
